# Patient Record
Sex: Female | Born: 1945 | Race: White | Hispanic: No | Marital: Married | Smoking: Never smoker
Health system: Southern US, Community
[De-identification: ages and names within clinical notes are randomized; demographics above are authoritative.]

## PROBLEM LIST (undated history)

## (undated) DIAGNOSIS — K219 Gastro-esophageal reflux disease without esophagitis: Secondary | ICD-10-CM

## (undated) DIAGNOSIS — T8859XA Other complications of anesthesia, initial encounter: Secondary | ICD-10-CM

## (undated) DIAGNOSIS — M5136 Other intervertebral disc degeneration, lumbar region: Secondary | ICD-10-CM

## (undated) DIAGNOSIS — I10 Essential (primary) hypertension: Secondary | ICD-10-CM

## (undated) DIAGNOSIS — F329 Major depressive disorder, single episode, unspecified: Secondary | ICD-10-CM

## (undated) DIAGNOSIS — H8109 Meniere's disease, unspecified ear: Secondary | ICD-10-CM

## (undated) DIAGNOSIS — M353 Polymyalgia rheumatica: Secondary | ICD-10-CM

## (undated) DIAGNOSIS — F32A Depression, unspecified: Secondary | ICD-10-CM

## (undated) DIAGNOSIS — M51369 Other intervertebral disc degeneration, lumbar region without mention of lumbar back pain or lower extremity pain: Secondary | ICD-10-CM

## (undated) DIAGNOSIS — F39 Unspecified mood [affective] disorder: Secondary | ICD-10-CM

## (undated) DIAGNOSIS — T4145XA Adverse effect of unspecified anesthetic, initial encounter: Secondary | ICD-10-CM

## (undated) DIAGNOSIS — F5105 Insomnia due to other mental disorder: Secondary | ICD-10-CM

## (undated) DIAGNOSIS — C50919 Malignant neoplasm of unspecified site of unspecified female breast: Secondary | ICD-10-CM

## (undated) DIAGNOSIS — Z9889 Other specified postprocedural states: Secondary | ICD-10-CM

## (undated) DIAGNOSIS — R112 Nausea with vomiting, unspecified: Secondary | ICD-10-CM

## (undated) DIAGNOSIS — Z803 Family history of malignant neoplasm of breast: Secondary | ICD-10-CM

## (undated) DIAGNOSIS — Z853 Personal history of malignant neoplasm of breast: Secondary | ICD-10-CM

## (undated) DIAGNOSIS — R5382 Chronic fatigue, unspecified: Secondary | ICD-10-CM

## (undated) DIAGNOSIS — J189 Pneumonia, unspecified organism: Secondary | ICD-10-CM

## (undated) DIAGNOSIS — Z923 Personal history of irradiation: Secondary | ICD-10-CM

## (undated) DIAGNOSIS — F419 Anxiety disorder, unspecified: Secondary | ICD-10-CM

## (undated) HISTORY — DX: Personal history of irradiation: Z92.3

## (undated) HISTORY — DX: Other intervertebral disc degeneration, lumbar region: M51.36

## (undated) HISTORY — DX: Family history of malignant neoplasm of breast: Z80.3

## (undated) HISTORY — DX: Malignant neoplasm of unspecified site of unspecified female breast: C50.919

## (undated) HISTORY — PX: CATARACT EXTRACTION W/ INTRAOCULAR LENS IMPLANT: SHX1309

## (undated) HISTORY — DX: Personal history of malignant neoplasm of breast: Z85.3

## (undated) HISTORY — DX: Other intervertebral disc degeneration, lumbar region without mention of lumbar back pain or lower extremity pain: M51.369

## (undated) HISTORY — DX: Insomnia due to other mental disorder: F51.05

## (undated) HISTORY — DX: Polymyalgia rheumatica: M35.3

## (undated) HISTORY — DX: Unspecified mood (affective) disorder: F39

## (undated) HISTORY — DX: Meniere's disease, unspecified ear: H81.09

## (undated) HISTORY — DX: Anxiety disorder, unspecified: F41.9

## (undated) HISTORY — PX: ABDOMINAL HYSTERECTOMY: SHX81

## (undated) HISTORY — PX: APPENDECTOMY: SHX54

## (undated) HISTORY — PX: CHOLECYSTECTOMY: SHX55

## (undated) HISTORY — DX: Chronic fatigue, unspecified: R53.82

## (undated) HISTORY — DX: Gastro-esophageal reflux disease without esophagitis: K21.9

## (undated) HISTORY — DX: Essential (primary) hypertension: I10

---

## 2003-04-28 HISTORY — PX: COLONOSCOPY: SHX174

## 2003-04-28 HISTORY — PX: ESOPHAGOGASTRODUODENOSCOPY: SHX1529

## 2003-07-30 ENCOUNTER — Other Ambulatory Visit: Admission: RE | Admit: 2003-07-30 | Discharge: 2003-07-30 | Payer: Self-pay | Admitting: Dermatology

## 2003-11-20 ENCOUNTER — Ambulatory Visit (HOSPITAL_COMMUNITY): Admission: RE | Admit: 2003-11-20 | Discharge: 2003-11-20 | Payer: Self-pay | Admitting: Internal Medicine

## 2005-04-28 ENCOUNTER — Ambulatory Visit: Payer: Self-pay | Admitting: Internal Medicine

## 2006-05-19 ENCOUNTER — Ambulatory Visit: Payer: Self-pay | Admitting: Internal Medicine

## 2007-05-05 ENCOUNTER — Ambulatory Visit: Payer: Self-pay | Admitting: Internal Medicine

## 2008-11-06 ENCOUNTER — Encounter: Payer: Self-pay | Admitting: Gastroenterology

## 2009-07-26 HISTORY — PX: MASTECTOMY: SHX3

## 2009-08-26 ENCOUNTER — Observation Stay (HOSPITAL_COMMUNITY)
Admission: RE | Admit: 2009-08-26 | Discharge: 2009-08-27 | Payer: Self-pay | Source: Home / Self Care | Admitting: General Surgery

## 2009-08-26 ENCOUNTER — Encounter (INDEPENDENT_AMBULATORY_CARE_PROVIDER_SITE_OTHER): Payer: Self-pay | Admitting: General Surgery

## 2009-09-27 ENCOUNTER — Ambulatory Visit: Admission: RE | Admit: 2009-09-27 | Discharge: 2009-12-26 | Payer: Self-pay | Admitting: Radiation Oncology

## 2010-07-15 LAB — CBC
Hemoglobin: 12.8 g/dL (ref 12.0–15.0)
MCV: 86.3 fL (ref 78.0–100.0)
RBC: 4.17 MIL/uL (ref 3.87–5.11)
WBC: 4.9 10*3/uL (ref 4.0–10.5)

## 2010-07-15 LAB — BASIC METABOLIC PANEL
Chloride: 101 mEq/L (ref 96–112)
Creatinine, Ser: 0.83 mg/dL (ref 0.4–1.2)
GFR calc Af Amer: >60 mL/min (ref >60)
Potassium: 3.4 mEq/L — ABNORMAL LOW (ref 3.5–5.1)
Sodium: 137 mEq/L (ref 135–145)

## 2010-07-15 LAB — TYPE AND SCREEN: ABO/RH(D): O POS

## 2010-07-15 LAB — MRSA PCR SCREENING

## 2010-09-09 NOTE — Assessment & Plan Note (Signed)
NAME:  Alexandra Haynes, Alexandra Haynes               CHART#:  [id number]   DATE:  05/05/2007                       DOB:  [birth year]   CHIEF COMPLAINT:  Followup GERD and diarrhea-predominant IBS.   SUBJECTIVE:  Alexandra Haynes is a 65 year old Caucasian female who has  history of GERD which had previously had been responsive to PPI.  She  developed hot flashes and sweating with Aciphex, therefore she has  stopped it on her own.  She tells me she rarely has problems with acid  reflux, maybe once a month she will notice indigestion and  regurgitation, but only with certain foods.  She feels much better and  does not feel the need for daily PPI at this time.  She continues to  have intermittent diarrhea.  She tells me her IBS symptoms have been  much improved.  She takes an occasional hyoscyamine up to twice a day  but only when she is symptomatic which has been rare recently.  She is  using flaxseed daily.  Overall she feels very well.  She has lost 5  pounds and this has been intentional.   CURRENT MEDICATIONS:  See the list from 05/05/2007.   ALLERGIES:  Sulfa.   OBJECTIVE:  VITAL SIGNS:  Weight 168 pounds, height 65 inches, temp 98  degrees, blood pressure 120/88, pulse 72.  GENERAL:  Alexandra Haynes is well-developed, well-nourished Caucasian female  in no acute distress.  HEENT:  Sclerae clear, nonicteric.  Conjunctivae pink.  Oral pharynx  pink and moist.  CHEST:  Heart regular rate and rhythm.  Normal S1, S2.  ABDOMEN:  Positive bowel sounds times 4, no bruits auscultated; soft,  nontender, nondistended, no palpable masses, hepatosplenomegaly, rebound  tenderness or guarding.  EXTREMITIES:  Without clubbing or  edema bilaterally.   ASSESSMENT:  Alexandra Haynes is a 65 year old female with diarrhea,  predominant IBS well controlled with p.r.n. hyoscyamine.   GERD with rare symptoms.   PLAN:  1. She can use over-the-counter Pepcid AC or Zantac for rare GERD      symptoms.  2. GERD and IBS literature  given for her review.  3. Hyoscyamine 0.125 mg a.c. and h.s., #60 with 2 refills.  4. Colonoscopy 2015 or sooner if she has any problems.       Lorenza Burton, N.P.  Electronically Signed     R. Roetta Sessions, M.D.  Electronically Signed    KJ/MEDQ  D:  05/06/2007  T:  05/06/2007  Job:  [id number]   cc:   Wyvonnia Lora

## 2010-09-12 NOTE — Op Note (Signed)
NAME:  Alexandra Haynes, Alexandra Haynes                        ACCOUNT NO.:  [id number]   MEDICAL RECORD NO.:  [id number]                   PATIENT TYPE:  AMB   LOCATION:  DAY                                  FACILITY:  [facility]   PHYSICIAN:  R. Roetta Sessions, M.D.              DATE OF BIRTH:  01/01/1946   DATE OF PROCEDURE:  11/20/2003  DATE OF DISCHARGE:                                 OPERATIVE REPORT   PROCEDURES:  1. Esophagogastroduodenoscopy with dilation.  2. Colonoscopy screening.   ENDOSCOPIST:  Gerrit Friends. Rourk, M.D.   INDICATIONS FOR PROCEDURE:  The patient is a 65 year old lady with  esophageal dysphagia for both solids and liquids, postprandial abdominal  cramps, alternating constipation and diarrhea.  She is here for EGD to  evaluate her dysphagia.  She has never had a colonoscopy.  She is here for  screening colonoscopy as well.  This approach has been discussed with the  patient previously and again the potential risks, benefits and alternatives  have been reviewed and questions answered.  She is agreeable.  Please see  documentation of medical records.   DESCRIPTION OF PROCEDURE:  1. Oxygen saturation, blood pressure, pulse and respirations were monitored     throughout the entire procedure.  Conscious sedation with Versed 4 mg IV     and Demerol 75 mg IV in divided doses.  The instrument was the Olympus     video chip system.   Findings:  Examination of the tubular esophagus revealed a Schatzki's ring.  The remainder of the esophageal mucosa appeared normal.  The EG junction was  easily traversed.   Stomach:  The gastric cavity was entered, insufflated well with air.  Thorough examination of the gastric mucosa including a retroflexed view of  the proximal stomach, esophagus, and gastric junction demonstrated a small  hiatal hernia.  The pylorus was patent and easily traversed.  Examination of  the bulb and second portion revealed no abnormalities.   Therapy/diagnostic  maneuvers performed:  A 56 French Maloney dilator was  passed to full insertion.  Loop back revealed no apparent complications  related to passage of the dilator.   The patient tolerated the procedure well and was prepared for colonoscopy.   1. Digital rectal examination revealed no abnormalities.   Endoscopic findings.  Prep was good.  Rectum:  Examination of the rectal  mucosa including retroflexed view of the anal verge revealed no  abnormalities.   Colonic mucosa was surveyed from the rectosigmoid junction through a left  transverse right colon to the appendiceal orifice and ileocecal valve and  cecum.  These structures were well seen and photographed from the rectum.  The scope was slowly withdrawn.  All previous mucosa surfaces were again  seen.  The colonic mucosa appeared normal.  The patient tolerated the above  procedures well and was reactive in endoscopy.   IMPRESSION:  1. Esophagogastroduodenoscopy     a.  Noncritical Schatzki's ring.  Otherwise normal esophagus status post        dilation as described above.     b. Small hiatal hernia, otherwise normal stomach.  Normal D1-D2.  2. Colonoscopy findings:     a. Normal rectum.     b. Normal colon.   RECOMMENDATIONS:  1. Stop Nexium as this may be contributing to some of her abdominal     symptoms.  2. Begin Aciphex 20 mg daily one hour before breakfast.  3. Trial of NuLev one tablet under the tongue a.c. and h.s. p.r.n. abdominal     cramps and diarrhea.  4. Follow up appointment with me in six weeks to assess her progress.      ___________________________________________                                            Jonathon Bellows, M.D.   RMR/MEDQ  D:  11/20/2003  T:  11/20/2003  Job:  [id number]   cc:   Wyvonnia Lora  [address]  [place]  [place] [address]  Fax: [phone]

## 2010-09-12 NOTE — Consult Note (Signed)
Alexandra Haynes, Alexandra Haynes                          ACCOUNT NO.:  [id number]   MEDICAL RECORD NO.:  [id number]                  PATIENT TYPE:   LOCATION:                                       FACILITY:   PHYSICIAN:  R. Roetta Sessions, M.D.              DATE OF BIRTH:  [birth year]   DATE OF CONSULTATION:  10/25/2003  DATE OF DISCHARGE:                                   CONSULTATION   CHIEF COMPLAINT:  Abdominal pain.   The patient is a 65 year old Caucasian female who had previously been seen  in this clinic on greater than 10 years ago.  The patient has been referred  by Dr. Margo Common for her abdominal pain, cramps and gas.  The patient is status  post laparoscopic cholecystectomy for cholelithiasis in January of 2002.  The patient states that she has GERD, not responding to Nexium 40 mg q.d.  She says in the past several weeks she has noticed an increase in her  symptoms.  She denies hematemesis and does state that she has swallowing  difficulties with food and pills seeming to stick in the midsternal area.  The patient also reports dysphagia to liquids, stating that she cannot drink  a large bolus of liquid at one time and has to take small sips.  Regarding  her bowel movements, the patient states that she has alternating  constipation and diarrhea.  She denies hematochezia or melena.  The patient  states that she has had no loss of appetite or loss of weight.  She does  report some nausea, but suffers from Meniere's disease and attributes this  to vertigo.  The patient had an EGD many years ago and states that she has  never had a colonoscopy at the age of 80.   CURRENT MEDICATIONS:  1. Nexium 40 mg one q.d.  2. Potassium chloride two q.d.  3. Triam/HCTZ 37.5/12.5 one q.d.  4. Prozac 20 mg one q.d.  5. Multiple vitamin one q.d.  6. __________, which is an herbal supplement, one q.d.   PAST MEDICAL HISTORY:  1. Meniere's disease.  2. Chronic fatigue and malaise.   PAST SURGICAL  HISTORY:  1. Cholecystectomy.  2. Appendectomy.  3. Exploratory laparoscopy.   ALLERGIES:  SULFA.   FAMILY HISTORY:  The patient's mother is dead and had Alzheimer's disease.  Her father is deceased from complications of pneumonia and heart disease.  She has three sisters; one with irritable bowel syndrome.  She has two  brothers; one who died of cancer and one who is living.  The patient is  married.  She has two children.  Her daughter has irritable bowel syndrome.  She is self-employed in [employer], [employer].  She denies the use of alcohol,  tobacco or recreational drugs.   PHYSICAL EXAMINATION:  VITAL SIGNS:  Weight 159.5; height 5 feet 5 inches;  blood pressure 100/70; pulse is 82.  CARDIOVASCULAR:  Regular  rate and rhythm without murmurs, rubs or gallops.  RESPIRATIONS:  Clear to auscultation bilaterally.  EXTREMITIES:  No edema.  No clubbing or cyanosis.  RECTAL:  Hemoccult negative.  ABDOMEN:  Soft, nontender without hepatosplenomegaly or masses.  Bowel  sounds are present in all four quadrants.   IMPRESSION:  Worsening symptoms of gastroesophageal reflux disease, not  responding to q.d. Nexium 40 mg.  The patient has not had an  esophagogastroduodenoscopy in many years and, considering the worsening of  her symptoms, would benefit from having an esophagogastroduodenoscopy.  The  patient has never had a colonoscopy at the age of 66 and in view of her  abdominal pain, would benefit from this procedure also.  The patient's  symptoms of alternating constipation and diarrhea as well as gas, bloating  and pain would be in keeping with the diagnosis of irritable bowel syndrome.  If colonoscopy findings are negative, the patient could be started on an  antispasmodic.  In the meantime, the patient would benefit from the addition  of fiber to her diet and samples will be given.   RECOMMENDATIONS:  1. An EGD and TCS scheduled were Dr. Jena Gauss.  2. Increase Nexium to b.i.d.  3. Add  fiber supplement q.d.  The patient was given samples of various     products to choose the one that best suits her lifestyle.  4. Consider addition of NuLev after results are available from colonoscopy.  5. More recommendations to follow after the above procedures.   Thank you for allowing [place] to participate in this patient's care.     ________________________________________  ___________________________________________  Ashok Pall, PA                           Jonathon Bellows, M.D.   GC/MEDQ  D:  10/25/2003  T:  10/25/2003  Job:  [id number]   cc:   Wyvonnia Lora  [address]  [place]  [place] [address]  Fax: [phone]

## 2012-03-01 DIAGNOSIS — K589 Irritable bowel syndrome without diarrhea: Secondary | ICD-10-CM

## 2012-03-01 DIAGNOSIS — C50919 Malignant neoplasm of unspecified site of unspecified female breast: Secondary | ICD-10-CM

## 2012-03-01 DIAGNOSIS — H8109 Meniere's disease, unspecified ear: Secondary | ICD-10-CM

## 2012-08-30 DIAGNOSIS — E289 Ovarian dysfunction, unspecified: Secondary | ICD-10-CM

## 2012-08-30 DIAGNOSIS — C50919 Malignant neoplasm of unspecified site of unspecified female breast: Secondary | ICD-10-CM

## 2013-03-20 DIAGNOSIS — M899 Disorder of bone, unspecified: Secondary | ICD-10-CM

## 2013-03-20 DIAGNOSIS — C50919 Malignant neoplasm of unspecified site of unspecified female breast: Secondary | ICD-10-CM

## 2013-03-20 DIAGNOSIS — M949 Disorder of cartilage, unspecified: Secondary | ICD-10-CM

## 2013-06-26 ENCOUNTER — Ambulatory Visit
Admission: RE | Admit: 2013-06-26 | Discharge: 2013-06-26 | Disposition: A | Discharge status: Home / Self Care | Payer: Medicare Other | Source: Ambulatory Visit | Attending: Rheumatology | Admitting: Rheumatology

## 2013-06-26 ENCOUNTER — Other Ambulatory Visit: Payer: Self-pay | Admitting: Rheumatology

## 2013-06-26 DIAGNOSIS — M545 Low back pain, unspecified: Secondary | ICD-10-CM

## 2013-06-26 DIAGNOSIS — M542 Cervicalgia: Secondary | ICD-10-CM

## 2013-11-21 ENCOUNTER — Other Ambulatory Visit (HOSPITAL_COMMUNITY): Payer: Self-pay | Admitting: Rheumatology

## 2013-11-21 DIAGNOSIS — M5442 Lumbago with sciatica, left side: Principal | ICD-10-CM

## 2013-11-21 DIAGNOSIS — M5441 Lumbago with sciatica, right side: Secondary | ICD-10-CM

## 2013-11-28 ENCOUNTER — Ambulatory Visit (HOSPITAL_COMMUNITY): Payer: Medicare Other

## 2013-12-12 ENCOUNTER — Ambulatory Visit (HOSPITAL_COMMUNITY)
Admission: RE | Admit: 2013-12-12 | Discharge: 2013-12-12 | Disposition: A | Discharge status: Home / Self Care | Payer: Medicare Other | Source: Ambulatory Visit | Attending: Rheumatology | Admitting: Rheumatology

## 2013-12-12 ENCOUNTER — Encounter (HOSPITAL_COMMUNITY): Payer: Self-pay

## 2013-12-12 DIAGNOSIS — M545 Low back pain, unspecified: Secondary | ICD-10-CM | POA: Diagnosis present

## 2013-12-12 DIAGNOSIS — M51379 Other intervertebral disc degeneration, lumbosacral region without mention of lumbar back pain or lower extremity pain: Secondary | ICD-10-CM | POA: Insufficient documentation

## 2013-12-12 DIAGNOSIS — M5442 Lumbago with sciatica, left side: Secondary | ICD-10-CM

## 2013-12-12 DIAGNOSIS — M5137 Other intervertebral disc degeneration, lumbosacral region: Secondary | ICD-10-CM | POA: Diagnosis not present

## 2013-12-12 DIAGNOSIS — M47817 Spondylosis without myelopathy or radiculopathy, lumbosacral region: Secondary | ICD-10-CM | POA: Insufficient documentation

## 2013-12-12 DIAGNOSIS — M5441 Lumbago with sciatica, right side: Secondary | ICD-10-CM

## 2013-12-22 ENCOUNTER — Other Ambulatory Visit: Payer: Self-pay | Admitting: Rheumatology

## 2013-12-22 DIAGNOSIS — M549 Dorsalgia, unspecified: Secondary | ICD-10-CM

## 2013-12-22 DIAGNOSIS — M5126 Other intervertebral disc displacement, lumbar region: Secondary | ICD-10-CM

## 2013-12-26 ENCOUNTER — Ambulatory Visit
Admission: RE | Admit: 2013-12-26 | Discharge: 2013-12-26 | Disposition: A | Discharge status: Home / Self Care | Payer: Medicare Other | Source: Ambulatory Visit | Attending: Rheumatology | Admitting: Rheumatology

## 2013-12-26 DIAGNOSIS — M549 Dorsalgia, unspecified: Secondary | ICD-10-CM

## 2013-12-26 DIAGNOSIS — M5126 Other intervertebral disc displacement, lumbar region: Secondary | ICD-10-CM

## 2013-12-26 MED ORDER — IOHEXOL 180 MG/ML  SOLN
1.0000 mL | Freq: Once | INTRAMUSCULAR | Status: AC | PRN
  Administered 2013-12-26: 1 mL via EPIDURAL

## 2013-12-26 MED ORDER — METHYLPREDNISOLONE ACETATE 40 MG/ML INJ SUSP (RADIOLOG
120.0000 mg | Freq: Once | INTRAMUSCULAR | Status: AC
  Administered 2013-12-26: 120 mg via EPIDURAL

## 2013-12-26 NOTE — Discharge Instructions (Signed)

## 2014-01-31 ENCOUNTER — Other Ambulatory Visit: Payer: Self-pay | Admitting: Rheumatology

## 2014-01-31 DIAGNOSIS — M5126 Other intervertebral disc displacement, lumbar region: Secondary | ICD-10-CM

## 2014-02-13 ENCOUNTER — Ambulatory Visit
Admission: RE | Admit: 2014-02-13 | Discharge: 2014-02-13 | Disposition: A | Discharge status: Home / Self Care | Payer: Medicare Other | Source: Ambulatory Visit | Attending: Rheumatology | Admitting: Rheumatology

## 2014-02-13 DIAGNOSIS — M5126 Other intervertebral disc displacement, lumbar region: Secondary | ICD-10-CM

## 2014-02-13 MED ORDER — METHYLPREDNISOLONE ACETATE 40 MG/ML INJ SUSP (RADIOLOG
120.0000 mg | Freq: Once | INTRAMUSCULAR | Status: AC
  Administered 2014-02-13: 120 mg via EPIDURAL

## 2014-02-13 MED ORDER — IOHEXOL 180 MG/ML  SOLN
1.0000 mL | Freq: Once | INTRAMUSCULAR | Status: AC | PRN
  Administered 2014-02-13: 1 mL via EPIDURAL

## 2014-02-13 NOTE — Discharge Instructions (Signed)

## 2015-11-21 ENCOUNTER — Other Ambulatory Visit (HOSPITAL_COMMUNITY): Payer: Self-pay | Admitting: General Surgery

## 2015-11-21 DIAGNOSIS — N649 Disorder of breast, unspecified: Secondary | ICD-10-CM

## 2015-11-21 DIAGNOSIS — Z9012 Acquired absence of left breast and nipple: Secondary | ICD-10-CM

## 2015-11-26 ENCOUNTER — Ambulatory Visit (HOSPITAL_COMMUNITY)
Admission: RE | Admit: 2015-11-26 | Discharge: 2015-11-26 | Disposition: A | Discharge status: Home / Self Care | Payer: Medicare Other | Source: Ambulatory Visit | Attending: General Surgery | Admitting: General Surgery

## 2015-11-26 DIAGNOSIS — N643 Galactorrhea not associated with childbirth: Secondary | ICD-10-CM | POA: Insufficient documentation

## 2015-11-26 DIAGNOSIS — Z9012 Acquired absence of left breast and nipple: Secondary | ICD-10-CM

## 2015-11-26 DIAGNOSIS — N649 Disorder of breast, unspecified: Secondary | ICD-10-CM

## 2017-06-01 ENCOUNTER — Other Ambulatory Visit: Payer: Self-pay

## 2017-06-01 ENCOUNTER — Encounter (HOSPITAL_COMMUNITY): Payer: Self-pay | Admitting: Emergency Medicine

## 2017-06-01 DIAGNOSIS — R0600 Dyspnea, unspecified: Principal | ICD-10-CM | POA: Insufficient documentation

## 2017-06-01 DIAGNOSIS — I10 Essential (primary) hypertension: Secondary | ICD-10-CM | POA: Insufficient documentation

## 2017-06-01 DIAGNOSIS — R079 Chest pain, unspecified: Secondary | ICD-10-CM | POA: Diagnosis present

## 2017-06-01 DIAGNOSIS — R42 Dizziness and giddiness: Secondary | ICD-10-CM | POA: Diagnosis not present

## 2017-06-01 DIAGNOSIS — Z79899 Other long term (current) drug therapy: Secondary | ICD-10-CM | POA: Diagnosis not present

## 2017-06-01 DIAGNOSIS — R55 Syncope and collapse: Secondary | ICD-10-CM | POA: Insufficient documentation

## 2017-06-01 NOTE — ED Triage Notes (Signed)
Pt c/o her blood pressure has been high at home since 1700. Pt states she has felt weak last couple of days.

## 2017-06-02 ENCOUNTER — Other Ambulatory Visit: Payer: Self-pay

## 2017-06-02 ENCOUNTER — Emergency Department (HOSPITAL_COMMUNITY): Payer: Medicare Other

## 2017-06-02 ENCOUNTER — Encounter (HOSPITAL_COMMUNITY): Payer: Self-pay

## 2017-06-02 ENCOUNTER — Observation Stay (HOSPITAL_COMMUNITY): Payer: Medicare Other

## 2017-06-02 ENCOUNTER — Observation Stay (HOSPITAL_COMMUNITY)
Admission: EM | Admit: 2017-06-02 | Discharge: 2017-06-03 | Disposition: A | Discharge status: Home / Self Care | Payer: Medicare Other | Attending: Family Medicine | Admitting: Family Medicine

## 2017-06-02 ENCOUNTER — Observation Stay (HOSPITAL_BASED_OUTPATIENT_CLINIC_OR_DEPARTMENT_OTHER): Payer: Medicare Other

## 2017-06-02 DIAGNOSIS — R0600 Dyspnea, unspecified: Secondary | ICD-10-CM

## 2017-06-02 DIAGNOSIS — R42 Dizziness and giddiness: Secondary | ICD-10-CM

## 2017-06-02 DIAGNOSIS — R0609 Other forms of dyspnea: Secondary | ICD-10-CM

## 2017-06-02 DIAGNOSIS — R079 Chest pain, unspecified: Secondary | ICD-10-CM

## 2017-06-02 DIAGNOSIS — R55 Syncope and collapse: Secondary | ICD-10-CM

## 2017-06-02 DIAGNOSIS — I10 Essential (primary) hypertension: Secondary | ICD-10-CM

## 2017-06-02 LAB — BRAIN NATRIURETIC PEPTIDE: B Natriuretic Peptide: 23 pg/mL (ref 0.0–100.0)

## 2017-06-02 LAB — CBC WITH DIFFERENTIAL/PLATELET
Basophils Absolute: 0 10*3/uL (ref 0.0–0.1)
Basophils Relative: 0 %
EOS ABS: 0 10*3/uL (ref 0.0–0.7)
Eosinophils Relative: 1 %
HCT: 37 % (ref 36.0–46.0)
Hemoglobin: 12.1 g/dL (ref 12.0–15.0)
LYMPHS ABS: 1.5 10*3/uL (ref 0.7–4.0)
LYMPHS PCT: 23 %
MCH: 27.5 pg (ref 26.0–34.0)
MCHC: 32.7 g/dL (ref 30.0–36.0)
MCV: 84.1 fL (ref 78.0–100.0)
Monocytes Absolute: 0.7 10*3/uL (ref 0.1–1.0)
Monocytes Relative: 10 %
NEUTROS PCT: 66 %
Neutro Abs: 4.4 10*3/uL (ref 1.7–7.7)
Platelets: 252 10*3/uL (ref 150–400)
RBC: 4.4 MIL/uL (ref 3.87–5.11)
RDW: 13.2 % (ref 11.5–15.5)
WBC: 6.6 10*3/uL (ref 4.0–10.5)

## 2017-06-02 LAB — ECHOCARDIOGRAM COMPLETE
E decel time: 239 msec
E/e' ratio: 10.82
FS: 36 % (ref 28–44)
IV/PV OW: 0.99
LA vol A4C: 33.6 ml
LA vol index: 18.5 mL/m2
LA vol: 35.8 mL
LADIAMINDEX: 1.6 cm/m2
LASIZE: 31 mm
LDCA: 2.54 cm2
LEFT ATRIUM END SYS DIAM: 31 mm
LV E/e' medial: 10.82
LV E/e'average: 10.82
LV PW d: 10.5 mm — AB (ref 0.6–1.1)
LV e' LATERAL: 7.94 cm/s
LVOT VTI: 21.2 cm
LVOT diameter: 18 mm
LVOT peak grad rest: 4 mmHg
LVOT peak vel: 94.6 cm/s
LVOTSV: 54 mL
MV Dec: 239
MV Peak grad: 3 mmHg
MV pk E vel: 85.9 m/s
MVPKAVEL: 104 m/s
RV LATERAL S' VELOCITY: 15.3 cm/s
RV TAPSE: 25.2 mm
TDI e' lateral: 7.94
TDI e' medial: 4.79

## 2017-06-02 LAB — LIPID PANEL
CHOL/HDL RATIO: 2.2 ratio
Cholesterol: 173 mg/dL (ref 0–200)
HDL: 79 mg/dL (ref >40)
LDL CALC: 76 mg/dL (ref 0–99)
Triglycerides: 91 mg/dL (ref <150)
VLDL: 18 mg/dL (ref 0–40)

## 2017-06-02 LAB — COMPREHENSIVE METABOLIC PANEL
ALK PHOS: 75 U/L (ref 38–126)
ALT: 9 U/L — ABNORMAL LOW (ref 14–54)
AST: 21 U/L (ref 15–41)
Albumin: 4.2 g/dL (ref 3.5–5.0)
Anion gap: 13 (ref 5–15)
BUN: 15 mg/dL (ref 6–20)
CALCIUM: 9.6 mg/dL (ref 8.9–10.3)
CO2: 25 mmol/L (ref 22–32)
Chloride: 95 mmol/L — ABNORMAL LOW (ref 101–111)
Creatinine, Ser: 0.83 mg/dL (ref 0.44–1.00)
GFR calc non Af Amer: >60 mL/min (ref >60)
GLUCOSE: 108 mg/dL — AB (ref 65–99)
Potassium: 3.7 mmol/L (ref 3.5–5.1)
SODIUM: 133 mmol/L — AB (ref 135–145)
Total Bilirubin: 0.4 mg/dL (ref 0.3–1.2)
Total Protein: 8 g/dL (ref 6.5–8.1)

## 2017-06-02 LAB — I-STAT TROPONIN, ED
Troponin i, poc: 0 ng/mL (ref 0.00–0.08)
Troponin i, poc: 0.01 ng/mL (ref 0.00–0.08)

## 2017-06-02 LAB — MRSA PCR SCREENING: MRSA by PCR: NEGATIVE

## 2017-06-02 LAB — T4, FREE: Free T4: 0.86 ng/dL (ref 0.61–1.12)

## 2017-06-02 LAB — TSH: TSH: 8.145 u[IU]/mL — AB (ref 0.350–4.500)

## 2017-06-02 MED ORDER — LISINOPRIL 10 MG PO TABS: 10.0000 mg | ORAL_TABLET | Freq: Every day | ORAL | Status: DC

## 2017-06-02 MED ORDER — ENOXAPARIN SODIUM 40 MG/0.4ML ~~LOC~~ SOLN: 40.0000 mg | SUBCUTANEOUS | Status: DC

## 2017-06-02 MED ORDER — LISINOPRIL 10 MG PO TABS
20.0000 mg | ORAL_TABLET | Freq: Every day | ORAL | Status: DC
  Administered 2017-06-02 – 2017-06-03 (×2): 20 mg via ORAL
  Filled 2017-06-02 (×2): qty 2

## 2017-06-02 MED ORDER — ONDANSETRON HCL 4 MG/2ML IJ SOLN: 4.0000 mg | Freq: Four times a day (QID) | INTRAMUSCULAR | Status: DC | PRN

## 2017-06-02 MED ORDER — ASPIRIN 81 MG PO CHEW
324.0000 mg | CHEWABLE_TABLET | Freq: Once | ORAL | Status: AC
  Administered 2017-06-02: 324 mg via ORAL
  Filled 2017-06-02: qty 4

## 2017-06-02 MED ORDER — ACETAMINOPHEN 325 MG PO TABS: 650.0000 mg | ORAL_TABLET | ORAL | Status: DC | PRN

## 2017-06-02 NOTE — ED Provider Notes (Signed)
[facility] EMERGENCY DEPARTMENT Provider Note   CSN: [id number] Arrival date & time: 06/01/17  2311     History   Chief Complaint Chief Complaint  Patient presents with  . Hypertension    HPI Alexandra Haynes is a 73 y.o. female.  The history is provided by the patient. No language interpreter was used.  Shortness of Breath  This is a new problem. The average episode lasts 1 day. The problem occurs intermittently.The current episode started 12 to 24 hours ago. The problem has been gradually improving. Pertinent negatives include no fever, no headaches, no cough and no chest pain. She has tried nothing for the symptoms. The treatment provided no relief. Associated medical issues do not include asthma or CAD.  Pt reports she was walking with husband today and became very short of breath.  Pt reports she had walked about 1/2  of a block.  Pt reports she did not think she was going to get home.  Pt reports she felt weak.  Pt reports she has been having increased weakness recently.  Pt reports her blood pressure has been running a little high.  Pt husband has been checking her blood pressure at home.  History reviewed. No pertinent past medical history.  There are no active problems to display for this patient.   History reviewed. No pertinent surgical history.  OB History    No data available       Home Medications    Prior to Admission medications   Medication Sig Start Date End Date Taking? Authorizing Provider  AZO-CRANBERRY PO Take by mouth.   Yes [provider]  DULoxetine (CYMBALTA) 60 MG capsule Take 60 mg by mouth.   Yes [provider]  Multiple Vitamins-Minerals (THERA-M) TABS Take 1 tablet by mouth.   Yes [provider]  potassium chloride (K-DUR) 10 MEQ tablet Take 20 mEq by mouth. 09/11/13  Yes [provider]  triamterene-hydrochlorothiazide (MAXZIDE-25) 37.5-25 MG per tablet Take 1 tablet by mouth.   Yes [provider]    Family History No family history on file.  Social History Social History   Tobacco Use  . Smoking status: Never Smoker  . Smokeless tobacco: Never Used  Substance Use Topics  . Alcohol use: Not on file  . Drug use: Not on file     Allergies   Latex and Sulfa antibiotics   Review of Systems Review of Systems  Constitutional: Negative for fever.  Respiratory: Positive for shortness of breath. Negative for cough.   Cardiovascular: Negative for chest pain.  Neurological: Negative for headaches.  All other systems reviewed and are negative.    Physical Exam Updated Vital Signs BP (!) 163/79   Pulse 84   Temp 98.2 F (36.8 C)   Resp 18   Ht 5\' 5"  (1.651 m)   Wt 79.8 kg (176 lb)   SpO2 98%   BMI 29.29 kg/m   Physical Exam  Constitutional: She appears well-developed and well-nourished. No distress.  HENT:  Head: Normocephalic and atraumatic.  Right Ear: External ear normal.  Left Ear: External ear normal.  Nose: Nose normal.  Mouth/Throat: Oropharynx is clear and moist.  Eyes: Conjunctivae are normal.  Neck: Neck supple.  Cardiovascular: Normal rate and regular rhythm.  No murmur heard. Pulmonary/Chest: Effort normal and breath sounds normal. No respiratory distress.  Abdominal: Soft. There is no tenderness.  Musculoskeletal: She exhibits no edema.  Neurological: She is alert.  Skin: Skin is warm and  dry.  Psychiatric: She has a normal mood and affect.  Nursing note and vitals reviewed.    ED Treatments / Results  Labs (all labs ordered are listed, but only abnormal results are displayed) Labs Reviewed  CBC WITH DIFFERENTIAL/PLATELET  COMPREHENSIVE METABOLIC PANEL  I-STAT TROPONIN, ED    EKG  EKG Interpretation None       Radiology No results found.  Procedures Procedures (including critical care time)  Medications Ordered in ED Medications  aspirin chewable tablet 324 mg (not administered)     Initial Impression  / Assessment and Plan / ED Course  I have reviewed the triage vital signs and the nursing notes.  Pertinent labs & imaging results that were available during my care of the patient were reviewed by me and considered in my medical decision making (see chart for details).       Final Clinical Impressions(s) / ED Diagnoses   Final diagnoses:  None    ED Discharge Orders    None     1:00am.  Pt's care turned over to Dr. Christy Gentles. Labs pending.    Fransico Meadow, PA-C 06/02/17 0100    Ripley Fraise, MD 06/02/17 [phone]

## 2017-06-02 NOTE — ED Notes (Signed)
Report given to LIsa,RN at this time.

## 2017-06-02 NOTE — H&P (Addendum)
History and Physical    TKAI LARGE WUJ:[id number] DOB: 06-16-1945 DOA: 06/02/2017  PCP: Deloria Lair., MD   Patient coming from: Home  Chief Complaint: Exertional dyspnea  HPI: Alexandra Haynes is a 72 y.o. female with medical history significant for recently diagnosed hypertension as well as recent cataract surgery who presented to the ED after experiencing some mild weakness over the last 2 days.  She apparently went outside for a walk and was struggling to climb a slight incline due to some dyspnea that she is began to experience.  She denied any chest discomfort or pain, but did have some mild palpitations.  This quickly resolved with rest and the entire episode lasted approximately 1 hour.  She was then working in her garden and began to feel somewhat flush and nauseous and had some diaphoresis.  She states that she almost felt as though she was going to pass out, but did not do so.  She then presented to the ED for further evaluation.   ED Course: Vital signs are stable with some hypertension noted.  EKG with no acute findings and troponin 0.  Chest x-ray with some cardiomegaly, but no other findings otherwise noted.  Patient is currently asymptomatic.  Laboratory data demonstrates a mild hyponatremia and hypochloremia.  She has thus far, been given a full dose aspirin.  Review of Systems: As per HPI otherwise 10 point review of systems negative.   History reviewed. No pertinent past medical history.  History reviewed. No pertinent surgical history.   reports that  has never smoked. she has never used smokeless tobacco. Her alcohol and drug histories are not on file.  Allergies  Allergen Reactions  . Latex Other (See Comments)    Causes blisters  . Sulfa Antibiotics Other (See Comments)    Made her pass out.    No family history on file.  Prior to Admission medications   Medication Sig Start Date End Date Taking? Authorizing Provider  AZO-CRANBERRY PO Take by mouth.    Yes [provider]  DULoxetine (CYMBALTA) 60 MG capsule Take 60 mg by mouth.   Yes [provider]  Multiple Vitamins-Minerals (THERA-M) TABS Take 1 tablet by mouth.   Yes [provider]  potassium chloride (K-DUR) 10 MEQ tablet Take 20 mEq by mouth. 09/11/13  Yes [provider]  triamterene-hydrochlorothiazide (MAXZIDE-25) 37.5-25 MG per tablet Take 1 tablet by mouth.   Yes [provider]    Physical Exam: Vitals:   06/01/17 2352 06/01/17 2353 06/02/17 0130  BP: (!) 163/79  (!) 146/66  Pulse: 84  73  Resp: 18  17  Temp: 98.2 F (36.8 C)    SpO2: 98%  95%  Weight:  79.8 kg (176 lb)   Height:  5\' 5"  (1.651 m)     Constitutional: NAD, calm, comfortable Vitals:   06/01/17 2352 06/01/17 2353 06/02/17 0130  BP: (!) 163/79  (!) 146/66  Pulse: 84  73  Resp: 18  17  Temp: 98.2 F (36.8 C)    SpO2: 98%  95%  Weight:  79.8 kg (176 lb)   Height:  5\' 5"  (1.651 m)    Eyes: lids and conjunctivae normal ENMT: Mucous membranes are moist.  Neck: normal, supple Respiratory: clear to auscultation bilaterally. Normal respiratory effort. No accessory muscle use.  Cardiovascular: Regular rate and rhythm, no murmurs. No extremity edema. Abdomen: no tenderness, no distention. Bowel sounds positive.  Musculoskeletal:  No joint deformity upper and lower extremities.  Skin: no rashes, lesions, ulcers.  Psychiatric: Normal judgment and insight. Alert and oriented x 3. Normal mood.   Labs on Admission: I have personally reviewed following labs and imaging studies  CBC: Recent Labs  Lab 06/02/17 0057  WBC 6.6  NEUTROABS 4.4  HGB 12.1  HCT 37.0  MCV 84.1  PLT 119   Basic Metabolic Panel: Recent Labs  Lab 06/02/17 0057  NA 133*  K 3.7  CL 95*  CO2 25  GLUCOSE 108*  BUN 15  CREATININE 0.83  CALCIUM 9.6   GFR: Estimated Creatinine Clearance: 63 mL/min (by C-G formula based on SCr of 0.83 mg/dL). Liver Function Tests: Recent Labs    Lab 06/02/17 0057  AST 21  ALT 9*  ALKPHOS 75  BILITOT 0.4  PROT 8.0  ALBUMIN 4.2   No results for input(s): LIPASE, AMYLASE in the last 168 hours. No results for input(s): AMMONIA in the last 168 hours. Coagulation Profile: No results for input(s): INR, PROTIME in the last 168 hours. Cardiac Enzymes: No results for input(s): CKTOTAL, CKMB, CKMBINDEX, TROPONINI in the last 168 hours. BNP (last 3 results) No results for input(s): PROBNP in the last 8760 hours. HbA1C: No results for input(s): HGBA1C in the last 72 hours. CBG: No results for input(s): GLUCAP in the last 168 hours. Lipid Profile: No results for input(s): CHOL, HDL, LDLCALC, TRIG, CHOLHDL, LDLDIRECT in the last 72 hours. Thyroid Function Tests: No results for input(s): TSH, T4TOTAL, FREET4, T3FREE, THYROIDAB in the last 72 hours. Anemia Panel: No results for input(s): VITAMINB12, FOLATE, FERRITIN, TIBC, IRON, RETICCTPCT in the last 72 hours. Urine analysis: No results found for: COLORURINE, APPEARANCEUR, LABSPEC, PHURINE, GLUCOSEU, HGBUR, BILIRUBINUR, KETONESUR, PROTEINUR, UROBILINOGEN, NITRITE, LEUKOCYTESUR  Radiological Exams on Admission: Dg Chest Port 1 View  Result Date: 06/02/2017 CLINICAL DATA:  Chest discomfort high blood pressure EXAM: PORTABLE CHEST 1 VIEW COMPARISON:  None. FINDINGS: No acute pulmonary infiltrate or effusion. Borderline to mild cardiomegaly. Mediastinal contour within normal limits. Aortic atherosclerosis. No pneumothorax. IMPRESSION: Borderline to mild cardiomegaly. Electronically Signed   By: Donavan Foil M.D.   On: 06/02/2017 01:38    EKG: Independently reviewed. NSR with HR 75.  Assessment/Plan Principal Problem:   Dyspnea Active Problems:   Essential hypertension   Postural dizziness with presyncope    1. Exertional dyspnea.  Obtain 2D echocardiogram for further evaluation with cardiomegaly on CXR.  Patient currently asymptomatic without chest pain.  This may very likely be  related to her elevated blood pressure, but may have some structural heart changes as she has only recently been diagnosed.  Continue to trend troponin and monitor on telemetry due to heart score 4.  Check lipid panel as well as TSH. 2. Presyncope.  Obtain 2D echocardiogram, carotid ultrasound, and orthostatic vitals. 3. Hypertension-poorly controlled.  Initiate lisinopril 10 mg daily for now and will require outpatient reassessment of blood pressure control.  She is actually currently on no medications for this and has been told to try dietary control with follow-up in 3 months.   DVT prophylaxis: Lovenox Code Status: Full Family Communication: Husband at bedside Disposition Plan:Home in am Consults called:None Admission status: Obs, tele   Pratik Darleen Crocker DO [facility] Pager [phone]  If 7PM-7AM, please contact night-coverage [email or web address] Password [email or web address]  06/02/2017, 2:58 AM

## 2017-06-02 NOTE — Progress Notes (Signed)
*  PRELIMINARY RESULTS* Echocardiogram 2D Echocardiogram has been performed.  Samuel Germany 06/02/2017, 1:30 PM

## 2017-06-02 NOTE — Progress Notes (Signed)
PROGRESS NOTE    Patient: Alexandra Haynes     PCP: Deloria Lair., MD                    DOB: 1945-10-08            DOA: 06/02/2017 [id number]             DOS: 06/02/2017, 12:45 PM   Date of Service: the patient was seen and examined on 06/02/2017 Subjective:   Patient was seen and examined this morning.  Stable, in no acute distress.  Denies any chest pain or shortness of breath or dizziness.  Husband present at bedside. Per husband her blood pressure runs high at home  ----------------------------------------------------------------------------------------------------------------------  Brief Narrative:   Alexandra Haynes is a 72 y.o. female with medical history significant for recently diagnosed hypertension as well as recent cataract surgery who presented to the ED after experiencing some mild weakness over the last 2 days.  She apparently went outside for a walk and was struggling to climb a slight incline due to some dyspnea that she is began to experience.  She denied any chest discomfort or pain, but did have some mild palpitations.  This quickly resolved with rest and the entire episode lasted approximately 1 hour.  She was then working in her garden and began to feel somewhat flush and nauseous and had some diaphoresis.  She states that she almost felt as though she was going to pass out, but did not do so.  Principal Problem:   Dyspnea Active Problems:   Essential hypertension   Postural dizziness with presyncope   Assessment & Plan:   Exertional dyspnea -stable this a.m., no acute distress, reporting of improved  Body enzymes negative, pending 2D echocardiogram, Blood pressure still remains elevated,  Labs within normal limits, with exception of TSH mildly elevated, following up with free T3-T4  Presyncope -vasovagal versus, oxygen and blood pressure, pending 2D echocardiogram, bilateral carotid studies Orthostatic vitals have been normal  Continue to trend  troponin and monitor on telemetry due to heart score 4.    Presyncope.  Obtain 2D echocardiogram, carotid ultrasound, and orthostatic vitals.  Hypertension -poorly controlled, patient lisinopril has been increased from 20-20 mg today, will continue to monitor, will titrate and add medication accordingly.     DVT prophylaxis: Lovenox Code Status: Full Family Communication: Husband at bedside Disposition Plan:Home in am Consults called:None Admission status: Obs, tele   Procedures:    Echo  Antimicrobials:  Anti-infectives (From admission, onward)   None        Objective: Vitals:   06/02/17 0600 06/02/17 0630 06/02/17 0900 06/02/17 0921  BP: (!) 187/86 (!) 185/90 (!) 157/72 (!) 157/72  Pulse: 74 71 74 76  Resp: 16 17 16 18   Temp:      SpO2: 97% 98% 97% 98%  Weight:      Height:       No intake or output data in the 24 hours ending 06/02/17 1245 Filed Weights   06/01/17 2353  Weight: 79.8 kg (176 lb)    Examination:  General exam: Appears calm and comfortable  Respiratory system: Clear to auscultation. Respiratory effort normal. Cardiovascular system: S1 & S2 heard, RRR. No JVD, murmurs, rubs, gallops or clicks. No pedal edema. Gastrointestinal system: Abdomen is nondistended, soft and nontender. No organomegaly or masses felt. Normal bowel sounds heard. Central nervous system: Alert and oriented. No focal neurological deficits. Extremities: Symmetric 5 x 5 power. Skin: No  rashes, lesions or ulcers Psychiatry: Judgement and insight appear normal. Mood & affect appropriate.     Data Reviewed: I have personally reviewed following labs and imaging studies  CBC: Recent Labs  Lab 06/02/17 0057  WBC 6.6  NEUTROABS 4.4  HGB 12.1  HCT 37.0  MCV 84.1  PLT 211   Basic Metabolic Panel: Recent Labs  Lab 06/02/17 0057  NA 133*  K 3.7  CL 95*  CO2 25  GLUCOSE 108*  BUN 15  CREATININE 0.83  CALCIUM 9.6   GFR: Estimated Creatinine Clearance: 63  mL/min (by C-G formula based on SCr of 0.83 mg/dL). Liver Function Tests: Recent Labs  Lab 06/02/17 0057  AST 21  ALT 9*  ALKPHOS 75  BILITOT 0.4  PROT 8.0  ALBUMIN 4.2   No results for input(s): LIPASE, AMYLASE in the last 168 hours. No results for input(s): AMMONIA in the last 168 hours. Coagulation Profile: No results for input(s): INR, PROTIME in the last 168 hours. Cardiac Enzymes: No results for input(s): CKTOTAL, CKMB, CKMBINDEX, TROPONINI in the last 168 hours. BNP (last 3 results) No results for input(s): PROBNP in the last 8760 hours. HbA1C: No results for input(s): HGBA1C in the last 72 hours. CBG: No results for input(s): GLUCAP in the last 168 hours. Lipid Profile: Recent Labs    06/02/17 0348  CHOL 173  HDL 79  LDLCALC 76  TRIG 91  CHOLHDL 2.2   Thyroid Function Tests: Recent Labs    06/02/17 0057  TSH 8.145*   Anemia Panel: No results for input(s): VITAMINB12, FOLATE, FERRITIN, TIBC, IRON, RETICCTPCT in the last 72 hours. Sepsis Labs: No results for input(s): PROCALCITON, LATICACIDVEN in the last 168 hours.  No results found for this or any previous visit (from the past 240 hour(s)).     Radiology Studies: Dg Chest Port 1 View  Result Date: 06/02/2017 CLINICAL DATA:  Chest discomfort high blood pressure EXAM: PORTABLE CHEST 1 VIEW COMPARISON:  None. FINDINGS: No acute pulmonary infiltrate or effusion. Borderline to mild cardiomegaly. Mediastinal contour within normal limits. Aortic atherosclerosis. No pneumothorax. IMPRESSION: Borderline to mild cardiomegaly. Electronically Signed   By: Donavan Foil M.D.   On: 06/02/2017 01:38    Scheduled Meds: . enoxaparin (LOVENOX) injection  40 mg Subcutaneous Q24H  . lisinopril  20 mg Oral Daily   Continuous Infusions:   LOS: 0 days    Time spent: >25 minutes   Deatra James, MD [facility] Pager [phone]  If 7PM-7AM, please contact night-coverage [email or web address] Password  [email or web address] 06/02/2017, 12:45 PM

## 2017-06-02 NOTE — ED Notes (Signed)
Patient transported to Ultrasound 

## 2017-06-02 NOTE — Care Management Obs Status (Signed)
[place] NOTIFICATION   Patient Details  Name: Alexandra Haynes MRN: [id number] Date of Birth: [birth year]   Medicare Observation Status Notification Given:  Yes    Sherald Barge, RN 06/02/2017, 3:12 PM

## 2017-06-03 DIAGNOSIS — R0609 Other forms of dyspnea: Secondary | ICD-10-CM | POA: Diagnosis not present

## 2017-06-03 DIAGNOSIS — R0600 Dyspnea, unspecified: Secondary | ICD-10-CM | POA: Diagnosis not present

## 2017-06-03 LAB — T3, FREE: T3 FREE: 2.8 pg/mL (ref 2.0–4.4)

## 2017-06-03 MED ORDER — LISINOPRIL 20 MG PO TABS: 20.0000 mg | ORAL_TABLET | Freq: Every day | ORAL | 0 refills | Status: DC

## 2017-06-03 MED ORDER — ASPIRIN 81 MG PO TBEC: 81.0000 mg | DELAYED_RELEASE_TABLET | Freq: Every day | ORAL | 0 refills | Status: DC

## 2017-06-03 MED ORDER — ASPIRIN EC 81 MG PO TBEC
81.0000 mg | DELAYED_RELEASE_TABLET | Freq: Every day | ORAL | Status: DC
  Administered 2017-06-03: 81 mg via ORAL
  Filled 2017-06-03: qty 1

## 2017-06-03 NOTE — Progress Notes (Signed)
EKG completed and placed on chart 

## 2017-06-03 NOTE — Progress Notes (Signed)
PT Cancellation Note  Patient Details Name: Alexandra Haynes MRN: [id number] DOB: 1945/08/15   Cancelled Treatment:    Reason Eval/Treat Not Completed: Other (comment)Pt states she is getting around with independence and is imminently going home.  Declined visit.   Ramond Dial 06/03/2017, 10:38 AM   10:39 AM, 06/03/17 Mee Hives, PT, MS Physical Therapist - [place] [phone] (Office)

## 2017-06-03 NOTE — Discharge Summary (Signed)
Physician Discharge Summary      Patient: Alexandra Haynes                   Admit date: 06/02/2017   DOB: 1945/09/10             Discharge date:06/03/2017/9:14 AM [id number]                           PCP: Deloria Lair., MD Recommendations for Outpatient Follow-up:    Please follow-up with your primary care physician within 1-2 weeks.   Discharge Condition: Stable  CODE STATUS:  Full code  Diet recommendation:  Cardiac diet   ----------------------------------------------------------------------------------------------------------------------  Discharge Diagnoses:   Principal Problem:   Dyspnea Active Problems:   Essential hypertension   Postural dizziness with presyncope   History of present illness : Alexandra Haynes is a 72 y.o. female with medical history significant for recently diagnosed hypertension as well as recent cataract surgery who presented to the ED after experiencing some mild weakness over the last 2 days.  She apparently went outside for a walk and was struggling to climb a slight incline due to some dyspnea that she is began to experience.  She denied any chest discomfort or pain, but did have some mild palpitations.  This quickly resolved with rest and the entire episode lasted approximately 1 hour.  She was then working in her garden and began to feel somewhat flush and nauseous and had some diaphoresis.  She states that she almost felt as though she was going to pass out, but did not do so.   ED Course: Vital signs are stable with some hypertension noted.  EKG with no acute findings and troponin 0.  Chest x-ray with some cardiomegaly, but no other findings otherwise noted.  Patient is currently asymptomatic.  Laboratory data demonstrates a mild hyponatremia and hypochloremia.  She has thus far, been given a full dose aspirin  Hospital course / Brief Summary:  She was subsequently admitted for dyspnea with exertion, 2D echocardiogram was completed and  reviewed reporting ejection fraction of 60-65%, negative for any structural abnormalities, carotid ultrasound reporting 1-49% stenosis bilateral  Follow-up labs all within normal limits, lipid panel within normal limits Lipid panel total cholesterol 173 HDL 79 LDL 76 triglyceride 91  Thyroid function TSH mildly elevated at 8.145 free T3 normal at 2.8 free T4 normal at 0.86  Presyncope -was told to be more of a vasovagal in nature, no focal neurological findings, brief normal cardiovascular workup negative  Hypertension - poorly controlled, she responded well to addition of 20 mg of lisinopril, blood pressure is much improved We have instructed the patient to keep a log of her blood pressure as she may check it few times a day presented to PCP for further adjustment and titrating medication.  Disposition-patient stable to be discharged home.  Consultations:   none  Procedures: 2D echocardiogram Bilateral carotid studies ----------------------------------------------------------------------------------------------------------------------  Discharge Instructions:   Discharge Instructions    Activity as tolerated - No restrictions   Complete by:  As directed    Diet - low sodium heart healthy   Complete by:  As directed    Discharge instructions   Complete by:  As directed    Please follow-up with your PCP in 1-2 weeks.  Recommending to check your blood sugars a few times a day with a log and present to PCP for further adjustment of medication as needed  Increase activity slowly   Complete by:  As directed        Medication List    STOP taking these medications   triamterene-hydrochlorothiazide 37.5-25 MG tablet Commonly known as:  MAXZIDE-25     TAKE these medications   aspirin 81 MG EC tablet Take 1 tablet (81 mg total) by mouth daily.   AZO-CRANBERRY PO Take 2 tablets by mouth daily as needed (uti).   DULoxetine 60 MG capsule Commonly known as:  CYMBALTA Take 60  mg by mouth 2 (two) times daily.   lisinopril 20 MG tablet Commonly known as:  PRINIVIL,ZESTRIL Take 1 tablet (20 mg total) by mouth daily.   potassium chloride 10 MEQ tablet Commonly known as:  K-DUR Take 20 mEq by mouth daily.   THERA-M Tabs Take 1 tablet by mouth daily.       Allergies  Allergen Reactions  . Latex Other (See Comments)    Causes blisters  . Sulfa Antibiotics Other (See Comments)    Made her pass out.      Procedures/Studies: US Carotid Bilateral  Result Date: 06/02/2017 CLINICAL DATA:  72 year old female with presyncope and postural dizziness EXAM: BILATERAL CAROTID DUPLEX ULTRASOUND TECHNIQUE: Pearline Cables scale imaging, color Doppler and duplex ultrasound were performed of bilateral carotid and vertebral arteries in the neck. COMPARISON:  None FINDINGS: Criteria: Quantification of carotid stenosis is based on velocity parameters that correlate the residual internal carotid diameter with NASCET-based stenosis levels, using the diameter of the distal internal carotid lumen as the denominator for stenosis measurement. The following velocity measurements were obtained: RIGHT ICA:  108/22 cm/sec CCA:  630/16 cm/sec SYSTOLIC ICA/CCA RATIO:  1.0 DIASTOLIC ICA/CCA RATIO:  1.4 ECA:  102 cm/sec LEFT ICA:  85/25 cm/sec CCA:  010/93 cm/sec SYSTOLIC ICA/CCA RATIO:  0.7 DIASTOLIC ICA/CCA RATIO:  1.1 ECA:  110 cm/sec RIGHT CAROTID ARTERY: Mild hypoechoic atherosclerotic plaque along the proximal internal carotid artery. By peak systolic velocity criteria, the estimated stenosis remains less than 50%. RIGHT VERTEBRAL ARTERY:  Patent with normal antegrade flow. LEFT CAROTID ARTERY: Trace heterogeneous atherosclerotic plaque in the proximal internal carotid artery. By peak systolic velocity criteria, the estimated stenosis is less than 50%. LEFT VERTEBRAL ARTERY:  Patent with normal antegrade flow. IMPRESSION: 1. Mild (1-49%) stenosis proximal right internal carotid artery secondary to mild  smooth hypoechoic (lipid rich) atherosclerotic plaque. 2. Mild (1-49%) stenosis proximal left internal carotid artery secondary to mild heterogeneous atherosclerotic plaque. 3. Vertebral arteries are patent with antegrade flow. Signed, Criselda Peaches, MD [facility] Radiology Electronically Signed   By: Jacqulynn Cadet M.D.   On: 06/02/2017 13:21   Dg Chest Port 1 View  Result Date: 06/02/2017 CLINICAL DATA:  Chest discomfort high blood pressure EXAM: PORTABLE CHEST 1 VIEW COMPARISON:  None. FINDINGS: No acute pulmonary infiltrate or effusion. Borderline to mild cardiomegaly. Mediastinal contour within normal limits. Aortic atherosclerosis. No pneumothorax. IMPRESSION: Borderline to mild cardiomegaly. Electronically Signed   By: Donavan Foil M.D.   On: 06/02/2017 01:38      Subjective: Patient was seen and examined 06/03/2017, 9:14 AM Patient stable  Today. No acute distress.  No issues overnight Stable for discharge.  Discharge Exam:  Vitals:   06/02/17 1800 06/02/17 2100 06/02/17 2202 06/03/17 0500  BP: (!) 154/63 (!) 152/62  103/62  Pulse: 70 73  83  Resp: 18 18  18   Temp: 97.6 F (36.4 C) 97.6 F (36.4 C)  98 F (36.7 C)  TempSrc:  Oral Oral  Oral  SpO2: 100% 97% 96% 98%  Weight: 79.8 kg (176 lb)     Height: 5\' 5"  (1.651 m)       General: Pt lying comfortably in bed & appears in no obvious distress. Cardiovascular: S1 & S2 heard, RRR, S1/S2 +. No murmurs, rubs, gallops or clicks. No JVD or pedal edema. Respiratory: Clear to auscultation without wheezing, rhonchi or crackles. No increased work of breathing. Abdominal:  Non distended, non tender & soft. No organomegaly or masses appreciated. Normal bowel sounds heard. CNS: Alert and oriented. No focal deficits. Extremities: no edema, no cyanosis    The results of significant diagnostics from this hospitalization (including imaging, microbiology, ancillary and laboratory)  are listed below for reference.     Microbiology: Recent Results (from the past 240 hour(s))  MRSA PCR Screening     Status: None   Collection Time: 06/02/17  6:26 PM  Result Value Ref Range Status   MRSA by PCR NEGATIVE NEGATIVE Final    Comment:        The GeneXpert MRSA Assay (FDA approved for NASAL specimens only), is one component of a comprehensive MRSA colonization surveillance program. It is not intended to diagnose MRSA infection nor to guide or monitor treatment for MRSA infections. Performed at [facility], [address], [place],  [address]      Labs: CBC: Recent Labs  Lab 06/02/17 0057  WBC 6.6  NEUTROABS 4.4  HGB 12.1  HCT 37.0  MCV 84.1  PLT 892   Basic Metabolic Panel: Recent Labs  Lab 06/02/17 0057  NA 133*  K 3.7  CL 95*  CO2 25  GLUCOSE 108*  BUN 15  CREATININE 0.83  CALCIUM 9.6   Liver Function Tests: Recent Labs  Lab 06/02/17 0057  AST 21  ALT 9*  ALKPHOS 75  BILITOT 0.4  PROT 8.0  ALBUMIN 4.2   BNP (last 3 results) Recent Labs    06/02/17 0057  BNP 23.0   Cardiac Enzymes: No results for input(s): CKTOTAL, CKMB, CKMBINDEX, TROPONINI in the last 168 hours. CBG: No results for input(s): GLUCAP in the last 168 hours. Hgb A1c No results for input(s): HGBA1C in the last 72 hours. Lipid Profile Recent Labs    06/02/17 0348  CHOL 173  HDL 79  LDLCALC 76  TRIG 91  CHOLHDL 2.2   Thyroid function studies Recent Labs    06/02/17 0057 06/02/17 0058  TSH 8.145*  --   T3FREE  --  2.8   Anemia work up No results for input(s): VITAMINB12, FOLATE, FERRITIN, TIBC, IRON, RETICCTPCT in the last 72 hours. Urinalysis No results found for: COLORURINE, APPEARANCEUR, LABSPEC, PHURINE, GLUCOSEU, HGBUR, BILIRUBINUR, KETONESUR, PROTEINUR, UROBILINOGEN, NITRITE, LEUKOCYTESUR    Time coordinating discharge: Over 30 minutes  SIGNED: Deatra James, MD, FACP, FHM. [facility] Pager [phone]  If 7PM-7AM,  please contact night-coverage [email or web address] Password [facility] 06/03/2017, 9:14 AM

## 2018-03-02 ENCOUNTER — Telehealth: Payer: Self-pay | Admitting: *Deleted

## 2018-03-02 NOTE — Telephone Encounter (Signed)
Called and spoke with the patient, scheduled an appt for 11/12. Patient given instructions.

## 2018-03-08 ENCOUNTER — Inpatient Hospital Stay: Payer: Medicare Other | Attending: Gynecology | Admitting: Gynecology

## 2018-03-08 ENCOUNTER — Encounter: Payer: Self-pay | Admitting: Gynecology

## 2018-03-08 VITALS — HR 80 | Temp 98.0°F | Resp 20 | Ht 65.0 in | Wt 178.0 lb

## 2018-03-08 DIAGNOSIS — Z853 Personal history of malignant neoplasm of breast: Secondary | ICD-10-CM

## 2018-03-08 DIAGNOSIS — Z923 Personal history of irradiation: Secondary | ICD-10-CM

## 2018-03-08 DIAGNOSIS — C541 Malignant neoplasm of endometrium: Secondary | ICD-10-CM

## 2018-03-08 NOTE — Patient Instructions (Signed)
Preparing for your Surgery  Plan for surgery on March 15, 2018 with Dr. Everitt Amber at [facility] will be scheduled for a robotic assisted total hysterectomy, bilateral salpingo-oophorectomy, sentinel lymph node biopsy.   Pre-operative Testing -You will receive a phone call from presurgical testing at [facility] to arrange for a pre-operative testing appointment before your surgery.  This appointment normally occurs one to two weeks before your scheduled surgery.   -Bring your insurance card, copy of an advanced directive if applicable, medication list  -At that visit, you will be asked to sign a consent for a possible blood transfusion in case a transfusion becomes necessary during surgery.  The need for a blood transfusion is rare but having consent is a necessary part of your care.     -You should not be taking blood thinners or aspirin at least ten days prior to surgery unless instructed by your surgeon.  Day Before Surgery at [facility] will be asked to take in a light diet the day before surgery.  Avoid carbonated beverages.  You will be advised to have nothing to eat or drink after midnight the evening before.    Eat a light diet the day before surgery.  Examples including soups, broths, toast, yogurt, mashed potatoes.  Things to avoid include carbonated beverages (fizzy beverages), raw fruits and raw vegetables, or beans.   If your bowels are filled with gas, your surgeon will have difficulty visualizing your pelvic organs which increases your surgical risks.  Your role in recovery Your role is to become active as soon as directed by your doctor, while still giving yourself time to heal.  Rest when you feel tired. You will be asked to do the following in order to speed your recovery:  - Cough and breathe deeply. This helps toclear and expand your lungs and can prevent pneumonia. You may be given a spirometer to practice deep breathing. A staff  member will show you how to use the spirometer. - Do mild physical activity. Walking or moving your legs help your circulation and body functions return to normal. A staff member will help you when you try to walk and will provide you with simple exercises. Do not try to get up or walk alone the first time. - Actively manage your pain. Managing your pain lets you move in comfort. We will ask you to rate your pain on a scale of zero to 10. It is your responsibility to tell your doctor or nurse where and how much you hurt so your pain can be treated.  Special Considerations -If you are diabetic, you may be placed on insulin after surgery to have closer control over your blood sugars to promote healing and recovery.  This does not mean that you will be discharged on insulin.  If applicable, your oral antidiabetics will be resumed when you are tolerating a solid diet.  -Your final pathology results from surgery should be available around one week after surgery and the results will be relayed to you when available.  -Dr. Lahoma Crocker is the Surgeon that assists your GYN Oncologist with surgery.  The next day after your surgery you will either see your GYN Oncologist, Dr. Precious Haws, or Dr. Lahoma Crocker.  -FMLA forms can be faxed to [phone] and please allow 5-7 business days for completion.   Blood Transfusion Information WHAT IS A BLOOD TRANSFUSION? A transfusion is the replacement of blood or some of its parts. Blood is made  up of multiple cells which provide different functions.  Red blood cells carry oxygen and are used for blood loss replacement.  White blood cells fight against infection.  Platelets control bleeding.  Plasma helps clot blood.  Other blood products are available for specialized needs, such as hemophilia or other clotting disorders. BEFORE THE TRANSFUSION  Who gives blood for transfusions?   You may be able to donate blood to be used at a later date on  yourself (autologous donation).  Relatives can be asked to donate blood. This is generally not any safer than if you have received blood from a stranger. The same precautions are taken to ensure safety when a relative's blood is donated.  Healthy volunteers who are fully evaluated to make sure their blood is safe. This is blood bank blood. Transfusion therapy is the safest it has ever been in the practice of medicine. Before blood is taken from a donor, a complete history is taken to make sure that person has no history of diseases nor engages in risky social behavior (examples are intravenous drug use or sexual activity with multiple partners). The donor's travel history is screened to minimize risk of transmitting infections, such as malaria. The donated blood is tested for signs of infectious diseases, such as HIV and hepatitis. The blood is then tested to be sure it is compatible with you in order to minimize the chance of a transfusion reaction. If you or a relative donates blood, this is often done in anticipation of surgery and is not appropriate for emergency situations. It takes many days to process the donated blood. RISKS AND COMPLICATIONS Although transfusion therapy is very safe and saves many lives, the main dangers of transfusion include:   Getting an infectious disease.  Developing a transfusion reaction. This is an allergic reaction to something in the blood you were given. Every precaution is taken to prevent this. The decision to have a blood transfusion has been considered carefully by your caregiver before blood is given. Blood is not given unless the benefits outweigh the risks.

## 2018-03-08 NOTE — H&P (View-Only) (Signed)
Consult Note: Gyn-Onc   Alexandra Haynes 72 y.o. female  Chief Complaint  Patient presents with  . Endometrial cancer [facility])    Assessment : High-grade endometrial carcinoma  Plan: Natural history of endometrial carcinoma was discussed the patient and her family.  I would recommend she undergo robotically assisted laparoscopic hysterectomy and sentinel node biopsies.  Risks of the procedure were reviewed.  They are also informed that given the high-grade nature of this lesion postoperative chemotherapy and/or radiation therapy would likely be recommended.  All her questions were answered.  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  They are aware that Dr. Everitt Amber will be the primary surgeon.    HPI: 72 year old white married female seen in consultation at the request of Dr. Barrie Dunker regarding management of a high-grade endometrial carcinoma.  Patient presented with post menopausal bleeding and some cramping.  Endometrial biopsy was performed revealing a high-grade endometrial carcinoma.  The patient has had a CAT scan on March 07, 2018 showing no evidence of metastatic disease and a normal size uterus.  Currently the patient is have a little bit of spotting and some cramping but otherwise no other pelvic symptoms.  She has a past history of breast cancer treated with surgery and radiation therapy approximately 6 years ago.  Review of Systems:10 point review of systems is negative except as noted in interval history.   Vitals: Pulse 80, temperature 98 F (36.7 C), temperature source Oral, resp. rate 20, height 5\' 5"  (1.651 m), weight 178 lb (80.7 kg), SpO2 100 %.  Physical Exam: General : The patient is a healthy woman in no acute distress.  HEENT: normocephalic, extraoccular movements normal; neck is supple without thyromegally  Lynphnodes: Supraclavicular and inguinal nodes not enlarged  Abdomen: Soft,  non-tender, no ascites, no organomegally, no masses, no hernias  Pelvic:  EGBUS: Normal female  Vagina: Normal, no lesions  Urethra and Bladder: Normal, non-tender  Cervix: Normal no lesions are noted. Uterus: Anterior normal shape size and consistency Bi-manual examination: Non-tender; no adenxal masses or nodularity  Rectal: normal sphincter tone, no masses, no blood  Lower extremities: No edema or varicosities. Normal range of motion      Allergies  Allergen Reactions  . Latex Other (See Comments)    Causes blisters  . Sulfa Antibiotics Other (See Comments)    Made her pass out.    History reviewed. No pertinent past medical history.  History reviewed. No pertinent surgical history.  Current Outpatient Medications  Medication Sig Dispense Refill  . aspirin EC 81 MG EC tablet Take 1 tablet (81 mg total) by mouth daily. 30 tablet 0  . AZO-CRANBERRY PO Take 2 tablets by mouth daily as needed (uti).     . DULoxetine (CYMBALTA) 60 MG capsule Take 60 mg by mouth 2 (two) times daily.     Marland Kitchen lisinopril (PRINIVIL,ZESTRIL) 20 MG tablet Take 1 tablet (20 mg total) by mouth daily. 30 tablet 0  . Multiple Vitamins-Minerals (THERA-M) TABS Take 1 tablet by mouth daily.     . potassium chloride (K-DUR) 10 MEQ tablet Take 20 mEq by mouth daily.      No current facility-administered medications for this visit.     Social History   Socioeconomic History  . Marital status: Married    Spouse name: Not on file  . Number of children: Not on file  . Years of education: Not on file  .  Highest education level: Not on file  Occupational History  . Not on file  Social Needs  . Financial resource strain: Not on file  . Food insecurity:    Worry: Not on file    Inability: Not on file  . Transportation needs:    Medical: Not on file    Non-medical: Not on file  Tobacco Use  . Smoking status: Never Smoker  . Smokeless tobacco: Never Used  Substance and Sexual Activity  . Alcohol use:  Not on file  . Drug use: Not on file  . Sexual activity: Not on file  Lifestyle  . Physical activity:    Days per week: Not on file    Minutes per session: Not on file  . Stress: Not on file  Relationships  . Social connections:    Talks on phone: Not on file    Gets together: Not on file    Attends religious service: Not on file    Active member of club or organization: Not on file    Attends meetings of clubs or organizations: Not on file    Relationship status: Not on file  . Intimate partner violence:    Fear of current or ex partner: Not on file    Emotionally abused: Not on file    Physically abused: Not on file    Forced sexual activity: Not on file  Other Topics Concern  . Not on file  Social History Narrative  . Not on file    History reviewed. No pertinent family history.    Marti Sleigh, MD 03/08/2018, 1:36 PM

## 2018-03-08 NOTE — Progress Notes (Signed)
Consult Note: Gyn-Onc   Alexandra Haynes 72 y.o. female  Chief Complaint  Patient presents with  . Endometrial cancer [facility])    Assessment : High-grade endometrial carcinoma  Plan: Natural history of endometrial carcinoma was discussed the patient and her family.  I would recommend she undergo robotically assisted laparoscopic hysterectomy and sentinel node biopsies.  Risks of the procedure were reviewed.  They are also informed that given the high-grade nature of this lesion postoperative chemotherapy and/or radiation therapy would likely be recommended.  All her questions were answered.  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  They are aware that Dr. Everitt Amber will be the primary surgeon.    HPI: 72 year old white married female seen in consultation at the request of Dr. Barrie Dunker regarding management of a high-grade endometrial carcinoma.  Patient presented with post menopausal bleeding and some cramping.  Endometrial biopsy was performed revealing a high-grade endometrial carcinoma.  The patient has had a CAT scan on March 07, 2018 showing no evidence of metastatic disease and a normal size uterus.  Currently the patient is have a little bit of spotting and some cramping but otherwise no other pelvic symptoms.  She has a past history of breast cancer treated with surgery and radiation therapy approximately 6 years ago.  Review of Systems:10 point review of systems is negative except as noted in interval history.   Vitals: Pulse 80, temperature 98 F (36.7 C), temperature source Oral, resp. rate 20, height 5\' 5"  (1.651 m), weight 178 lb (80.7 kg), SpO2 100 %.  Physical Exam: General : The patient is a healthy woman in no acute distress.  HEENT: normocephalic, extraoccular movements normal; neck is supple without thyromegally  Lynphnodes: Supraclavicular and inguinal nodes not enlarged  Abdomen: Soft,  non-tender, no ascites, no organomegally, no masses, no hernias  Pelvic:  EGBUS: Normal female  Vagina: Normal, no lesions  Urethra and Bladder: Normal, non-tender  Cervix: Normal no lesions are noted. Uterus: Anterior normal shape size and consistency Bi-manual examination: Non-tender; no adenxal masses or nodularity  Rectal: normal sphincter tone, no masses, no blood  Lower extremities: No edema or varicosities. Normal range of motion      Allergies  Allergen Reactions  . Latex Other (See Comments)    Causes blisters  . Sulfa Antibiotics Other (See Comments)    Made her pass out.    History reviewed. No pertinent past medical history.  History reviewed. No pertinent surgical history.  Current Outpatient Medications  Medication Sig Dispense Refill  . aspirin EC 81 MG EC tablet Take 1 tablet (81 mg total) by mouth daily. 30 tablet 0  . AZO-CRANBERRY PO Take 2 tablets by mouth daily as needed (uti).     . DULoxetine (CYMBALTA) 60 MG capsule Take 60 mg by mouth 2 (two) times daily.     Marland Kitchen lisinopril (PRINIVIL,ZESTRIL) 20 MG tablet Take 1 tablet (20 mg total) by mouth daily. 30 tablet 0  . Multiple Vitamins-Minerals (THERA-M) TABS Take 1 tablet by mouth daily.     . potassium chloride (K-DUR) 10 MEQ tablet Take 20 mEq by mouth daily.      No current facility-administered medications for this visit.     Social History   Socioeconomic History  . Marital status: Married    Spouse name: Not on file  . Number of children: Not on file  . Years of education: Not on file  .  Highest education level: Not on file  Occupational History  . Not on file  Social Needs  . Financial resource strain: Not on file  . Food insecurity:    Worry: Not on file    Inability: Not on file  . Transportation needs:    Medical: Not on file    Non-medical: Not on file  Tobacco Use  . Smoking status: Never Smoker  . Smokeless tobacco: Never Used  Substance and Sexual Activity  . Alcohol use:  Not on file  . Drug use: Not on file  . Sexual activity: Not on file  Lifestyle  . Physical activity:    Days per week: Not on file    Minutes per session: Not on file  . Stress: Not on file  Relationships  . Social connections:    Talks on phone: Not on file    Gets together: Not on file    Attends religious service: Not on file    Active member of club or organization: Not on file    Attends meetings of clubs or organizations: Not on file    Relationship status: Not on file  . Intimate partner violence:    Fear of current or ex partner: Not on file    Emotionally abused: Not on file    Physically abused: Not on file    Forced sexual activity: Not on file  Other Topics Concern  . Not on file  Social History Narrative  . Not on file    History reviewed. No pertinent family history.    Marti Sleigh, MD 03/08/2018, 1:36 PM

## 2018-03-09 NOTE — Progress Notes (Signed)
ekg 06-03-17 epic  cxr 06-02-17 epic ' Echo 06-02-17 epic

## 2018-03-09 NOTE — Patient Instructions (Addendum)
Alexandra Haynes  03/09/2018   Your procedure is scheduled on: 03-15-18  Report to [facility] Main  Entrance  Report to admitting at     0800  AM    Call this number if you have problems the morning of surgery [phone]   Eat a light diet the day before surgery.  Examples including soups, broths, toast, yogurt, mashed potatoes.  Things to avoid include carbonated beverages (fizzy beverages), raw fruits and raw vegetables, or beans.   If your bowels are filled with gas, your surgeon will have difficulty visualizing your pelvic organs which increases your surgical risks.  NO SOLID FOOD AFTER MIDNIGHT THE NIGHT PRIOR TO SURGERY. NOTHING BY MOUTH EXCEPT CLEAR LIQUIDS UNTIL 3 HOURS PRIOR TO [place] SURGERY. PLEASE FINISH ENSURE DRINK PER SURGEON   ORDER 3 HOURS PRIOR TO SCHEDULED SURGERY TIME WHICH NEEDS TO BE COMPLETED AT ____0730  Am then nothing by mouth________.     BRUSH YOUR TEETH MORNING OF SURGERY AND RINSE YOUR MOUTH OUT, NO CHEWING GUM CANDY OR MINTS.     Take these medicines the morning of surgery with A SIP OF WATER: cymbalta                                 You may not have any metal on your body including hair pins and              piercings  Do not wear jewelry, make-up, lotions, powders or perfumes, deodorant             Do not wear nail polish.  Do not shave  48 hours prior to surgery.      Do not bring valuables to the hospital. [place].  Contacts, dentures or bridgework may not be worn into surgery.  Leave suitcase in the car. After surgery it may be brought to your room.               Please read over the following fact sheets you were given: ___________________________________________________________________          [facility] - Preparing for Surgery Before surgery, you can play an important role.  Because skin is not sterile, your skin needs to be as free of germs as possible.   You can reduce the number of germs on your skin by washing with CHG (chlorahexidine gluconate) soap before surgery.  CHG is an antiseptic cleaner which kills germs and bonds with the skin to continue killing germs even after washing. Please DO NOT use if you have an allergy to CHG or antibacterial soaps.  If your skin becomes reddened/irritated stop using the CHG and inform your nurse when you arrive at Short Stay. Do not shave (including legs and underarms) for at least 48 hours prior to the first CHG shower.  You may shave your face/neck. Please follow these instructions carefully:  1.  Shower with CHG Soap the night before surgery and the  morning of Surgery.  2.  If you choose to wash your hair, wash your hair first as usual with your  normal  shampoo.  3.  After you shampoo, rinse your hair and body thoroughly to remove the  shampoo.  4.  Use CHG as you would any other liquid soap.  You can apply chg directly  to the skin and wash                       Gently with a scrungie or clean washcloth.  5.  Apply the CHG Soap to your body ONLY FROM THE NECK DOWN.   Do not use on face/ open                           Wound or open sores. Avoid contact with eyes, ears mouth and genitals (private parts).                       Wash face,  Genitals (private parts) with your normal soap.             6.  Wash thoroughly, paying special attention to the area where your surgery  will be performed.  7.  Thoroughly rinse your body with warm water from the neck down.  8.  DO NOT shower/wash with your normal soap after using and rinsing off  the CHG Soap.                9.  Pat yourself dry with a clean towel.            10.  Wear clean pajamas.            11.  Place clean sheets on your bed the night of your first shower and do not  sleep with pets. Day of Surgery : Do not apply any lotions/deodorants the morning of surgery.  Please wear clean clothes to the hospital/surgery  center.  FAILURE TO FOLLOW THESE INSTRUCTIONS MAY RESULT IN THE CANCELLATION OF YOUR SURGERY PATIENT SIGNATURE_________________________________  NURSE SIGNATURE__________________________________  ________________________________________________________________________  WHAT IS A BLOOD TRANSFUSION? Blood Transfusion Information  A transfusion is the replacement of blood or some of its parts. Blood is made up of multiple cells which provide different functions.  Red blood cells carry oxygen and are used for blood loss replacement.  White blood cells fight against infection.  Platelets control bleeding.  Plasma helps clot blood.  Other blood products are available for specialized needs, such as hemophilia or other clotting disorders. BEFORE THE TRANSFUSION  Who gives blood for transfusions?   Healthy volunteers who are fully evaluated to make sure their blood is safe. This is blood bank blood. Transfusion therapy is the safest it has ever been in the practice of medicine. Before blood is taken from a donor, a complete history is taken to make sure that person has no history of diseases nor engages in risky social behavior (examples are intravenous drug use or sexual activity with multiple partners). The donor's travel history is screened to minimize risk of transmitting infections, such as malaria. The donated blood is tested for signs of infectious diseases, such as HIV and hepatitis. The blood is then tested to be sure it is compatible with you in order to minimize the chance of a transfusion reaction. If you or a relative donates blood, this is often done in anticipation of surgery and is not appropriate for emergency situations. It takes many days to process the donated blood. RISKS AND COMPLICATIONS Although transfusion therapy is very safe and saves many lives, the main dangers of transfusion include:   Getting an infectious disease.  Developing a transfusion reaction. This  is an  allergic reaction to something in the blood you were given. Every precaution is taken to prevent this. The decision to have a blood transfusion has been considered carefully by your caregiver before blood is given. Blood is not given unless the benefits outweigh the risks. AFTER THE TRANSFUSION  Right after receiving a blood transfusion, you will usually feel much better and more energetic. This is especially true if your red blood cells have gotten low (anemic). The transfusion raises the level of the red blood cells which carry oxygen, and this usually causes an energy increase.  The nurse administering the transfusion will monitor you carefully for complications. HOME CARE INSTRUCTIONS  No special instructions are needed after a transfusion. You may find your energy is better. Speak with your caregiver about any limitations on activity for underlying diseases you may have. SEEK MEDICAL CARE IF:   Your condition is not improving after your transfusion.  You develop redness or irritation at the intravenous (IV) site. SEEK IMMEDIATE MEDICAL CARE IF:  Any of the following symptoms occur over the next 12 hours:  Shaking chills.  You have a temperature by mouth above 102 F (38.9 C), not controlled by medicine.  Chest, back, or muscle pain.  People around you feel you are not acting correctly or are confused.  Shortness of breath or difficulty breathing.  Dizziness and fainting.  You get a rash or develop hives.  You have a decrease in urine output.  Your urine turns a dark color or changes to pink, red, or brown. Any of the following symptoms occur over the next 10 days:  You have a temperature by mouth above 102 F (38.9 C), not controlled by medicine.  Shortness of breath.  Weakness after normal activity.  The white part of the eye turns yellow (jaundice).  You have a decrease in the amount of urine or are urinating less often.  Your urine turns a dark color or changes  to pink, red, or brown. Document Released: 04/10/2000 Document Revised: 07/06/2011 Document Reviewed: 11/28/2007 ExitCare Patient Information 2014 [place].  _______________________________________________________________________  Incentive Spirometer  An incentive spirometer is a tool that can help keep your lungs clear and active. This tool measures how well you are filling your lungs with each breath. Taking long deep breaths may help reverse or decrease the chance of developing breathing (pulmonary) problems (especially infection) following:  A long period of time when you are unable to move or be active. BEFORE THE PROCEDURE   If the spirometer includes an indicator to show your best effort, your nurse or respiratory therapist will set it to a desired goal.  If possible, sit up straight or lean slightly forward. Try not to slouch.  Hold the incentive spirometer in an upright position. INSTRUCTIONS FOR USE  1. Sit on the edge of your bed if possible, or sit up as far as you can in bed or on a chair. 2. Hold the incentive spirometer in an upright position. 3. Breathe out normally. 4. Place the mouthpiece in your mouth and seal your lips tightly around it. 5. Breathe in slowly and as deeply as possible, raising the piston or the ball toward the top of the column. 6. Hold your breath for 3-5 seconds or for as long as possible. Allow the piston or ball to fall to the bottom of the column. 7. Remove the mouthpiece from your mouth and breathe out normally. 8. Rest for a few seconds and repeat Steps 1  through 7 at least 10 times every 1-2 hours when you are awake. Take your time and take a few normal breaths between deep breaths. 9. The spirometer may include an indicator to show your best effort. Use the indicator as a goal to work toward during each repetition. 10. After each set of 10 deep breaths, practice coughing to be sure your lungs are clear. If you have an incision (the cut  made at the time of surgery), support your incision when coughing by placing a pillow or rolled up towels firmly against it. Once you are able to get out of bed, walk around indoors and cough well. You may stop using the incentive spirometer when instructed by your caregiver.  RISKS AND COMPLICATIONS  Take your time so you do not get dizzy or light-headed.  If you are in pain, you may need to take or ask for pain medication before doing incentive spirometry. It is harder to take a deep breath if you are having pain. AFTER USE  Rest and breathe slowly and easily.  It can be helpful to keep track of a log of your progress. Your caregiver can provide you with a simple table to help with this. If you are using the spirometer at home, follow these instructions: [place] IF:   You are having difficultly using the spirometer.  You have trouble using the spirometer as often as instructed.  Your pain medication is not giving enough relief while using the spirometer.  You develop fever of 100.5 F (38.1 C) or higher. SEEK IMMEDIATE MEDICAL CARE IF:   You cough up bloody sputum that had not been present before.  You develop fever of 102 F (38.9 C) or greater.  You develop worsening pain at or near the incision site. MAKE SURE YOU:   Understand these instructions.  Will watch your condition.  Will get help right away if you are not doing well or get worse. Document Released: 08/24/2006 Document Revised: 07/06/2011 Document Reviewed: 10/25/2006 [facility] Patient Information 2014 [place], [place].   ________________________________________________________________________

## 2018-03-14 ENCOUNTER — Encounter (HOSPITAL_COMMUNITY)
Admission: RE | Admit: 2018-03-14 | Discharge: 2018-03-14 | Disposition: A | Discharge status: Home / Self Care | Payer: Medicare Other | Source: Ambulatory Visit | Attending: Gynecologic Oncology | Admitting: Gynecologic Oncology

## 2018-03-14 ENCOUNTER — Other Ambulatory Visit: Payer: Self-pay

## 2018-03-14 ENCOUNTER — Encounter (HOSPITAL_COMMUNITY): Payer: Self-pay

## 2018-03-14 DIAGNOSIS — F329 Major depressive disorder, single episode, unspecified: Secondary | ICD-10-CM | POA: Diagnosis not present

## 2018-03-14 DIAGNOSIS — Z882 Allergy status to sulfonamides status: Secondary | ICD-10-CM | POA: Diagnosis not present

## 2018-03-14 DIAGNOSIS — Z923 Personal history of irradiation: Secondary | ICD-10-CM | POA: Diagnosis not present

## 2018-03-14 DIAGNOSIS — C541 Malignant neoplasm of endometrium: Secondary | ICD-10-CM | POA: Insufficient documentation

## 2018-03-14 DIAGNOSIS — M1990 Unspecified osteoarthritis, unspecified site: Secondary | ICD-10-CM | POA: Diagnosis not present

## 2018-03-14 DIAGNOSIS — G709 Myoneural disorder, unspecified: Secondary | ICD-10-CM | POA: Diagnosis not present

## 2018-03-14 DIAGNOSIS — Z79899 Other long term (current) drug therapy: Secondary | ICD-10-CM | POA: Diagnosis not present

## 2018-03-14 DIAGNOSIS — Z01812 Encounter for preprocedural laboratory examination: Secondary | ICD-10-CM | POA: Insufficient documentation

## 2018-03-14 DIAGNOSIS — D649 Anemia, unspecified: Secondary | ICD-10-CM | POA: Diagnosis not present

## 2018-03-14 DIAGNOSIS — Z7982 Long term (current) use of aspirin: Secondary | ICD-10-CM | POA: Diagnosis not present

## 2018-03-14 DIAGNOSIS — Z91040 Latex allergy status: Secondary | ICD-10-CM | POA: Diagnosis not present

## 2018-03-14 DIAGNOSIS — N950 Postmenopausal bleeding: Secondary | ICD-10-CM | POA: Diagnosis not present

## 2018-03-14 DIAGNOSIS — K219 Gastro-esophageal reflux disease without esophagitis: Secondary | ICD-10-CM | POA: Diagnosis not present

## 2018-03-14 DIAGNOSIS — F419 Anxiety disorder, unspecified: Secondary | ICD-10-CM | POA: Diagnosis not present

## 2018-03-14 DIAGNOSIS — I10 Essential (primary) hypertension: Secondary | ICD-10-CM | POA: Diagnosis not present

## 2018-03-14 DIAGNOSIS — Z853 Personal history of malignant neoplasm of breast: Secondary | ICD-10-CM | POA: Diagnosis not present

## 2018-03-14 HISTORY — DX: Depression, unspecified: F32.A

## 2018-03-14 HISTORY — DX: Pneumonia, unspecified organism: J18.9

## 2018-03-14 HISTORY — DX: Other specified postprocedural states: Z98.890

## 2018-03-14 HISTORY — DX: Other complications of anesthesia, initial encounter: T88.59XA

## 2018-03-14 HISTORY — DX: Other specified postprocedural states: R11.2

## 2018-03-14 HISTORY — DX: Adverse effect of unspecified anesthetic, initial encounter: T41.45XA

## 2018-03-14 HISTORY — DX: Major depressive disorder, single episode, unspecified: F32.9

## 2018-03-14 LAB — COMPREHENSIVE METABOLIC PANEL
ALBUMIN: 4.2 g/dL (ref 3.5–5.0)
ALK PHOS: 68 U/L (ref 38–126)
ALT: 11 U/L (ref 0–44)
AST: 27 U/L (ref 15–41)
Anion gap: 6 (ref 5–15)
BILIRUBIN TOTAL: 0.5 mg/dL (ref 0.3–1.2)
BUN: 16 mg/dL (ref 8–23)
CALCIUM: 9 mg/dL (ref 8.9–10.3)
CO2: 31 mmol/L (ref 22–32)
CREATININE: 0.82 mg/dL (ref 0.44–1.00)
Chloride: 99 mmol/L (ref 98–111)
GFR calc Af Amer: >60 mL/min (ref >60)
GFR calc non Af Amer: >60 mL/min (ref >60)
Glucose, Bld: 108 mg/dL — ABNORMAL HIGH (ref 70–99)
Potassium: 3.5 mmol/L (ref 3.5–5.1)
SODIUM: 136 mmol/L (ref 135–145)
TOTAL PROTEIN: 7.6 g/dL (ref 6.5–8.1)

## 2018-03-14 LAB — URINALYSIS, ROUTINE W REFLEX MICROSCOPIC
BACTERIA UA: NONE SEEN
Bilirubin Urine: NEGATIVE
Glucose, UA: NEGATIVE mg/dL
HGB URINE DIPSTICK: NEGATIVE
Ketones, ur: NEGATIVE mg/dL
NITRITE: NEGATIVE
PROTEIN: NEGATIVE mg/dL
SPECIFIC GRAVITY, URINE: 1.013 (ref 1.005–1.030)
pH: 7 (ref 5.0–8.0)

## 2018-03-14 LAB — CBC
HCT: 36.6 % (ref 36.0–46.0)
HEMOGLOBIN: 11.7 g/dL — AB (ref 12.0–15.0)
MCH: 27.7 pg (ref 26.0–34.0)
MCHC: 32 g/dL (ref 30.0–36.0)
MCV: 86.5 fL (ref 80.0–100.0)
Platelets: 270 10*3/uL (ref 150–400)
RBC: 4.23 MIL/uL (ref 3.87–5.11)
RDW: 13.2 % (ref 11.5–15.5)
WBC: 5.8 10*3/uL (ref 4.0–10.5)
nRBC: 0 % (ref 0.0–0.2)

## 2018-03-14 LAB — ABO/RH: ABO/RH(D): O POS

## 2018-03-14 NOTE — Progress Notes (Signed)
UA done 03-14-18 routed to Dr. Denman George via epic

## 2018-03-15 ENCOUNTER — Ambulatory Visit (HOSPITAL_COMMUNITY)
Admission: RE | Admit: 2018-03-15 | Discharge: 2018-03-16 | Disposition: A | Discharge status: Home / Self Care | Payer: Medicare Other | Source: Ambulatory Visit | Attending: Gynecologic Oncology | Admitting: Gynecologic Oncology

## 2018-03-15 ENCOUNTER — Encounter (HOSPITAL_COMMUNITY)
Admission: RE | Disposition: A | Discharge status: Home / Self Care | Payer: Self-pay | Source: Ambulatory Visit | Attending: Gynecologic Oncology

## 2018-03-15 ENCOUNTER — Ambulatory Visit (HOSPITAL_COMMUNITY): Payer: Medicare Other | Admitting: Anesthesiology

## 2018-03-15 ENCOUNTER — Other Ambulatory Visit: Payer: Self-pay

## 2018-03-15 ENCOUNTER — Encounter (HOSPITAL_COMMUNITY): Payer: Self-pay | Admitting: *Deleted

## 2018-03-15 DIAGNOSIS — G709 Myoneural disorder, unspecified: Secondary | ICD-10-CM | POA: Insufficient documentation

## 2018-03-15 DIAGNOSIS — N950 Postmenopausal bleeding: Secondary | ICD-10-CM | POA: Insufficient documentation

## 2018-03-15 DIAGNOSIS — Z882 Allergy status to sulfonamides status: Secondary | ICD-10-CM | POA: Insufficient documentation

## 2018-03-15 DIAGNOSIS — I10 Essential (primary) hypertension: Secondary | ICD-10-CM | POA: Insufficient documentation

## 2018-03-15 DIAGNOSIS — D649 Anemia, unspecified: Secondary | ICD-10-CM | POA: Insufficient documentation

## 2018-03-15 DIAGNOSIS — Z923 Personal history of irradiation: Secondary | ICD-10-CM | POA: Diagnosis not present

## 2018-03-15 DIAGNOSIS — F419 Anxiety disorder, unspecified: Secondary | ICD-10-CM | POA: Insufficient documentation

## 2018-03-15 DIAGNOSIS — M1990 Unspecified osteoarthritis, unspecified site: Secondary | ICD-10-CM | POA: Insufficient documentation

## 2018-03-15 DIAGNOSIS — K219 Gastro-esophageal reflux disease without esophagitis: Secondary | ICD-10-CM | POA: Insufficient documentation

## 2018-03-15 DIAGNOSIS — Z91040 Latex allergy status: Secondary | ICD-10-CM | POA: Insufficient documentation

## 2018-03-15 DIAGNOSIS — C541 Malignant neoplasm of endometrium: Secondary | ICD-10-CM | POA: Diagnosis present

## 2018-03-15 DIAGNOSIS — Z79899 Other long term (current) drug therapy: Secondary | ICD-10-CM | POA: Insufficient documentation

## 2018-03-15 DIAGNOSIS — F329 Major depressive disorder, single episode, unspecified: Secondary | ICD-10-CM | POA: Insufficient documentation

## 2018-03-15 DIAGNOSIS — Z853 Personal history of malignant neoplasm of breast: Secondary | ICD-10-CM | POA: Diagnosis not present

## 2018-03-15 DIAGNOSIS — Z7982 Long term (current) use of aspirin: Secondary | ICD-10-CM | POA: Insufficient documentation

## 2018-03-15 HISTORY — PX: ROBOTIC ASSISTED TOTAL HYSTERECTOMY WITH BILATERAL SALPINGO OOPHERECTOMY: SHX6086

## 2018-03-15 HISTORY — PX: SENTINEL NODE BIOPSY: SHX6608

## 2018-03-15 LAB — TYPE AND SCREEN
ABO/RH(D): O POS
Antibody Screen: NEGATIVE

## 2018-03-15 SURGERY — HYSTERECTOMY, TOTAL, ROBOT-ASSISTED, LAPAROSCOPIC, WITH BILATERAL SALPINGO-OOPHORECTOMY
Anesthesia: General

## 2018-03-15 MED ORDER — INDOCYANINE GREEN 25 MG IV SOLR
INTRAVENOUS | Status: DC | PRN
  Administered 2018-03-15: 2.5 mg

## 2018-03-15 MED ORDER — FENTANYL CITRATE (PF) 100 MCG/2ML IJ SOLN
INTRAMUSCULAR | Status: AC
  Filled 2018-03-15: qty 2

## 2018-03-15 MED ORDER — STERILE WATER FOR INJECTION IJ SOLN
INTRAMUSCULAR | Status: DC | PRN
  Administered 2018-03-15: 1 mL

## 2018-03-15 MED ORDER — PROPOFOL 10 MG/ML IV BOLUS
INTRAVENOUS | Status: DC | PRN
  Administered 2018-03-15: 30 mg via INTRAVENOUS
  Administered 2018-03-15: 150 mg via INTRAVENOUS

## 2018-03-15 MED ORDER — MIDAZOLAM HCL 5 MG/5ML IJ SOLN
INTRAMUSCULAR | Status: DC | PRN
  Administered 2018-03-15 (×2): 1 mg via INTRAVENOUS

## 2018-03-15 MED ORDER — ASPIRIN EC 81 MG PO TBEC
81.0000 mg | DELAYED_RELEASE_TABLET | Freq: Every day | ORAL | Status: DC
  Administered 2018-03-16: 81 mg via ORAL
  Filled 2018-03-15: qty 1

## 2018-03-15 MED ORDER — LACTATED RINGERS IV SOLN
INTRAVENOUS | Status: DC
  Administered 2018-03-15 (×2): via INTRAVENOUS

## 2018-03-15 MED ORDER — LOSARTAN POTASSIUM 25 MG PO TABS
25.0000 mg | ORAL_TABLET | Freq: Every day | ORAL | Status: DC
  Administered 2018-03-16: 25 mg via ORAL
  Filled 2018-03-15: qty 1

## 2018-03-15 MED ORDER — HYDRALAZINE HCL 20 MG/ML IJ SOLN
INTRAMUSCULAR | Status: DC | PRN
  Administered 2018-03-15: 2.5 mg via INTRAVENOUS
  Administered 2018-03-15: 5 mg via INTRAVENOUS
  Administered 2018-03-15: 2.5 mg via INTRAVENOUS

## 2018-03-15 MED ORDER — ROCURONIUM BROMIDE 100 MG/10ML IV SOLN
INTRAVENOUS | Status: DC | PRN
  Administered 2018-03-15: 10 mg via INTRAVENOUS
  Administered 2018-03-15: 20 mg via INTRAVENOUS
  Administered 2018-03-15: 50 mg via INTRAVENOUS

## 2018-03-15 MED ORDER — FENTANYL CITRATE (PF) 100 MCG/2ML IJ SOLN
25.0000 ug | INTRAMUSCULAR | Status: DC | PRN
  Administered 2018-03-15 (×3): 50 ug via INTRAVENOUS

## 2018-03-15 MED ORDER — FENTANYL CITRATE (PF) 250 MCG/5ML IJ SOLN
INTRAMUSCULAR | Status: AC
  Filled 2018-03-15: qty 5

## 2018-03-15 MED ORDER — LACTATED RINGERS IR SOLN
Status: DC | PRN
  Administered 2018-03-15: 1000 mL

## 2018-03-15 MED ORDER — SUGAMMADEX SODIUM 200 MG/2ML IV SOLN
INTRAVENOUS | Status: DC | PRN
  Administered 2018-03-15: 300 mg via INTRAVENOUS

## 2018-03-15 MED ORDER — ESMOLOL HCL 100 MG/10ML IV SOLN
INTRAVENOUS | Status: DC | PRN
  Administered 2018-03-15: 20 mg via INTRAVENOUS

## 2018-03-15 MED ORDER — GABAPENTIN 300 MG PO CAPS
300.0000 mg | ORAL_CAPSULE | ORAL | Status: AC
  Administered 2018-03-15: 300 mg via ORAL
  Filled 2018-03-15: qty 1

## 2018-03-15 MED ORDER — LIDOCAINE HCL (CARDIAC) PF 100 MG/5ML IV SOSY
PREFILLED_SYRINGE | INTRAVENOUS | Status: DC | PRN
  Administered 2018-03-15: 30 mg via INTRAVENOUS

## 2018-03-15 MED ORDER — LIDOCAINE 2% (20 MG/ML) 5 ML SYRINGE
INTRAMUSCULAR | Status: AC
  Filled 2018-03-15: qty 5

## 2018-03-15 MED ORDER — OXYCODONE HCL 5 MG PO TABS
5.0000 mg | ORAL_TABLET | ORAL | Status: DC | PRN
  Administered 2018-03-15 – 2018-03-16 (×2): 5 mg via ORAL
  Filled 2018-03-15 (×2): qty 1

## 2018-03-15 MED ORDER — STERILE WATER FOR INJECTION IJ SOLN
INTRAMUSCULAR | Status: AC
  Filled 2018-03-15: qty 10

## 2018-03-15 MED ORDER — FENTANYL CITRATE (PF) 100 MCG/2ML IJ SOLN
INTRAMUSCULAR | Status: DC | PRN
  Administered 2018-03-15: 25 ug via INTRAVENOUS
  Administered 2018-03-15 (×2): 50 ug via INTRAVENOUS
  Administered 2018-03-15: 25 ug via INTRAVENOUS
  Administered 2018-03-15: 50 ug via INTRAVENOUS
  Administered 2018-03-15: 25 ug via INTRAVENOUS
  Administered 2018-03-15 (×4): 50 ug via INTRAVENOUS
  Administered 2018-03-15: 75 ug via INTRAVENOUS

## 2018-03-15 MED ORDER — IBUPROFEN 400 MG PO TABS
600.0000 mg | ORAL_TABLET | Freq: Four times a day (QID) | ORAL | Status: DC
  Administered 2018-03-16: 600 mg via ORAL
  Filled 2018-03-15: qty 1

## 2018-03-15 MED ORDER — HYDROMORPHONE HCL 1 MG/ML IJ SOLN: 0.5000 mg | INTRAMUSCULAR | Status: DC | PRN

## 2018-03-15 MED ORDER — ONDANSETRON HCL 4 MG/2ML IJ SOLN
4.0000 mg | Freq: Four times a day (QID) | INTRAMUSCULAR | Status: DC | PRN
  Administered 2018-03-15: 4 mg via INTRAVENOUS
  Filled 2018-03-15: qty 2

## 2018-03-15 MED ORDER — KCL IN DEXTROSE-NACL 20-5-0.45 MEQ/L-%-% IV SOLN
INTRAVENOUS | Status: DC
  Administered 2018-03-15 – 2018-03-16 (×2): via INTRAVENOUS
  Filled 2018-03-15 (×3): qty 1000

## 2018-03-15 MED ORDER — HYDRALAZINE HCL 20 MG/ML IJ SOLN
INTRAMUSCULAR | Status: AC
  Filled 2018-03-15: qty 1

## 2018-03-15 MED ORDER — ENOXAPARIN SODIUM 40 MG/0.4ML ~~LOC~~ SOLN
40.0000 mg | SUBCUTANEOUS | Status: DC
  Administered 2018-03-16: 40 mg via SUBCUTANEOUS
  Filled 2018-03-15: qty 0.4

## 2018-03-15 MED ORDER — SUGAMMADEX SODIUM 200 MG/2ML IV SOLN
INTRAVENOUS | Status: AC
  Filled 2018-03-15: qty 2

## 2018-03-15 MED ORDER — PROPOFOL 10 MG/ML IV BOLUS
INTRAVENOUS | Status: AC
  Filled 2018-03-15: qty 20

## 2018-03-15 MED ORDER — MIDAZOLAM HCL 2 MG/2ML IJ SOLN
INTRAMUSCULAR | Status: AC
  Filled 2018-03-15: qty 2

## 2018-03-15 MED ORDER — ENOXAPARIN SODIUM 40 MG/0.4ML ~~LOC~~ SOLN
40.0000 mg | SUBCUTANEOUS | Status: AC
  Administered 2018-03-15: 40 mg via SUBCUTANEOUS
  Filled 2018-03-15: qty 0.4

## 2018-03-15 MED ORDER — ONDANSETRON HCL 4 MG PO TABS: 4.0000 mg | ORAL_TABLET | Freq: Four times a day (QID) | ORAL | Status: DC | PRN

## 2018-03-15 MED ORDER — DEXAMETHASONE SODIUM PHOSPHATE 4 MG/ML IJ SOLN: 4.0000 mg | INTRAMUSCULAR | Status: DC

## 2018-03-15 MED ORDER — ONDANSETRON HCL 4 MG/2ML IJ SOLN
INTRAMUSCULAR | Status: AC
  Filled 2018-03-15: qty 2

## 2018-03-15 MED ORDER — ROCURONIUM BROMIDE 100 MG/10ML IV SOLN
INTRAVENOUS | Status: AC
  Filled 2018-03-15: qty 1

## 2018-03-15 MED ORDER — TRAMADOL HCL 50 MG PO TABS
100.0000 mg | ORAL_TABLET | Freq: Two times a day (BID) | ORAL | Status: DC | PRN
  Administered 2018-03-16: 50 mg via ORAL
  Filled 2018-03-15: qty 2

## 2018-03-15 MED ORDER — SCOPOLAMINE 1 MG/3DAYS TD PT72
1.0000 | MEDICATED_PATCH | TRANSDERMAL | Status: DC
  Filled 2018-03-15: qty 1

## 2018-03-15 MED ORDER — ONDANSETRON HCL 4 MG/2ML IJ SOLN
INTRAMUSCULAR | Status: DC | PRN
  Administered 2018-03-15: 4 mg via INTRAVENOUS

## 2018-03-15 MED ORDER — DEXAMETHASONE SODIUM PHOSPHATE 10 MG/ML IJ SOLN
INTRAMUSCULAR | Status: AC
  Filled 2018-03-15: qty 1

## 2018-03-15 MED ORDER — SENNOSIDES-DOCUSATE SODIUM 8.6-50 MG PO TABS
2.0000 | ORAL_TABLET | Freq: Every day | ORAL | Status: DC
  Administered 2018-03-15: 2 via ORAL
  Filled 2018-03-15: qty 2

## 2018-03-15 MED ORDER — ACETAMINOPHEN 500 MG PO TABS
1000.0000 mg | ORAL_TABLET | Freq: Two times a day (BID) | ORAL | Status: DC
  Administered 2018-03-15 – 2018-03-16 (×2): 1000 mg via ORAL
  Filled 2018-03-15 (×2): qty 2

## 2018-03-15 MED ORDER — ONDANSETRON HCL 4 MG/2ML IJ SOLN: 4.0000 mg | Freq: Once | INTRAMUSCULAR | Status: DC | PRN

## 2018-03-15 MED ORDER — ACETAMINOPHEN 500 MG PO TABS
1000.0000 mg | ORAL_TABLET | ORAL | Status: AC
  Administered 2018-03-15: 1000 mg via ORAL
  Filled 2018-03-15: qty 2

## 2018-03-15 MED ORDER — CEFAZOLIN SODIUM-DEXTROSE 2-4 GM/100ML-% IV SOLN
2.0000 g | INTRAVENOUS | Status: AC
  Administered 2018-03-15: 2 g via INTRAVENOUS
  Filled 2018-03-15: qty 100

## 2018-03-15 MED ORDER — EPHEDRINE SULFATE 50 MG/ML IJ SOLN
INTRAMUSCULAR | Status: DC | PRN
  Administered 2018-03-15: 10 mg via INTRAVENOUS

## 2018-03-15 SURGICAL SUPPLY — 54 items
APPLICATOR SURGIFLO ENDO (HEMOSTASIS) IMPLANT
BAG LAPAROSCOPIC 12 15 PORT 16 (BASKET) IMPLANT
BAG RETRIEVAL 12/15 (BASKET)
BAG RETRIEVAL 12/15MM (BASKET)
COVER BACK TABLE 60X90IN (DRAPES) ×3 IMPLANT
COVER TIP SHEARS 8 DVNC (MISCELLANEOUS) ×1 IMPLANT
COVER TIP SHEARS 8MM DA VINCI (MISCELLANEOUS) ×2
COVER WAND RF STERILE (DRAPES) ×3 IMPLANT
DERMABOND ADVANCED (GAUZE/BANDAGES/DRESSINGS) ×2
DERMABOND ADVANCED .7 DNX12 (GAUZE/BANDAGES/DRESSINGS) ×1 IMPLANT
DRAPE ARM DVNC X/XI (DISPOSABLE) ×4 IMPLANT
DRAPE COLUMN DVNC XI (DISPOSABLE) ×1 IMPLANT
DRAPE DA VINCI XI ARM (DISPOSABLE) ×8
DRAPE DA VINCI XI COLUMN (DISPOSABLE) ×2
DRAPE SHEET LG 3/4 BI-LAMINATE (DRAPES) ×3 IMPLANT
DRAPE SURG IRRIG POUCH 19X23 (DRAPES) ×3 IMPLANT
ELECT REM PT RETURN 15FT ADLT (MISCELLANEOUS) ×3 IMPLANT
GAUZE 4X4 16PLY RFD (DISPOSABLE) ×3 IMPLANT
GLOVE BIO SURGEON STRL SZ 6 (GLOVE) IMPLANT
GLOVE BIO SURGEON STRL SZ 6.5 (GLOVE) IMPLANT
GLOVE BIO SURGEONS STRL SZ 6.5 (GLOVE)
GLOVE SURG SS PI 6.0 STRL IVOR (GLOVE) ×12 IMPLANT
GLOVE SURG SS PI 6.5 STRL IVOR (GLOVE) ×6 IMPLANT
GOWN STRL REUS W/ TWL LRG LVL3 (GOWN DISPOSABLE) ×2 IMPLANT
GOWN STRL REUS W/TWL LRG LVL3 (GOWN DISPOSABLE) ×4
HOLDER FOLEY CATH W/STRAP (MISCELLANEOUS) ×3 IMPLANT
IRRIG SUCT STRYKERFLOW 2 WTIP (MISCELLANEOUS) ×3
IRRIGATION SUCT STRKRFLW 2 WTP (MISCELLANEOUS) ×1 IMPLANT
KIT PROCEDURE DA VINCI SI (MISCELLANEOUS) ×2
KIT PROCEDURE DVNC SI (MISCELLANEOUS) ×1 IMPLANT
MANIPULATOR UTERINE 4.5 ZUMI (MISCELLANEOUS) ×3 IMPLANT
NEEDLE SPNL 18GX3.5 QUINCKE PK (NEEDLE) ×3 IMPLANT
OBTURATOR OPTICAL STANDARD 8MM (TROCAR) ×2
OBTURATOR OPTICAL STND 8 DVNC (TROCAR) ×1
OBTURATOR OPTICALSTD 8 DVNC (TROCAR) ×1 IMPLANT
PACK ROBOT GYN CUSTOM WL (TRAY / TRAY PROCEDURE) ×3 IMPLANT
PAD POSITIONING PINK XL (MISCELLANEOUS) ×3 IMPLANT
PORT ACCESS TROCAR AIRSEAL 12 (TROCAR) ×1 IMPLANT
PORT ACCESS TROCAR AIRSEAL 5M (TROCAR) ×2
POUCH SPECIMEN RETRIEVAL 10MM (ENDOMECHANICALS) IMPLANT
SEAL CANN UNIV 5-8 DVNC XI (MISCELLANEOUS) ×4 IMPLANT
SEAL XI 5MM-8MM UNIVERSAL (MISCELLANEOUS) ×8
SET TRI-LUMEN FLTR TB AIRSEAL (TUBING) ×3 IMPLANT
SURGIFLO W/THROMBIN 8M KIT (HEMOSTASIS) IMPLANT
SUT MNCRL AB 4-0 PS2 18 (SUTURE) ×3 IMPLANT
SUT VIC AB 0 CT1 27 (SUTURE)
SUT VIC AB 0 CT1 27XBRD ANTBC (SUTURE) IMPLANT
SYR 10ML LL (SYRINGE) ×3 IMPLANT
TOWEL OR NON WOVEN STRL DISP B (DISPOSABLE) ×3 IMPLANT
TRAP SPECIMEN MUCOUS 40CC (MISCELLANEOUS) IMPLANT
TRAY FOLEY METER SIL LF 16FR (CATHETERS) ×3 IMPLANT
TRAY FOLEY MTR SLVR 16FR STAT (SET/KITS/TRAYS/PACK) IMPLANT
UNDERPAD 30X30 (UNDERPADS AND DIAPERS) ×3 IMPLANT
WATER STERILE IRR 1000ML POUR (IV SOLUTION) ×3 IMPLANT

## 2018-03-15 NOTE — Anesthesia Procedure Notes (Signed)
Procedure Name: Intubation Date/Time: 03/15/2018 11:36 AM Performed by: Garrel Ridgel, CRNA Pre-anesthesia Checklist: Patient identified, Emergency Drugs available, Suction available, Patient being monitored and Timeout performed Patient Re-evaluated:Patient Re-evaluated prior to induction Oxygen Delivery Method: Circle system utilized Preoxygenation: Pre-oxygenation with 100% oxygen Induction Type: IV induction Ventilation: Mask ventilation without difficulty Laryngoscope Size: Mac and 3 Grade View: Grade II Tube size: 7.0 mm Number of attempts: 1 Airway Equipment and Method: Stylet Secured at: 22 cm Tube secured with: Tape Dental Injury: Teeth and Oropharynx as per pre-operative assessment

## 2018-03-15 NOTE — Op Note (Addendum)
OPERATIVE NOTE 03/15/18  Surgeon: Donaciano Eva   Assistants: Dr Lahoma Crocker (an MD assistant was necessary for tissue manipulation, management of robotic instrumentation, retraction and positioning due to the complexity of the case and hospital policies).   Anesthesia: General endotracheal anesthesia  ASA Class: 3   Pre-operative Diagnosis: endometrial cancer grade 3  Post-operative Diagnosis: same,   Operation: Robotic-assisted laparoscopic total hysterectomy with bilateral salpingoophorectomy, SLN biopsy, vaginal biopsy  Surgeon: Donaciano Eva  Assistant Surgeon: Lahoma Crocker MD  Anesthesia: GET  Urine Output: 300cc  Operative Findings:  : 6cm grossly normal uterus, adhesions between cecum and anterior abdominal wall. No suspicious nodes. 2cm exophytic tongue of vaginal mucosa from the right upper lateral vagina (biopsy negative for maligancy).   Estimated Blood Loss:  less than 50 mL      Total IV Fluids: 900 ml         Specimens: uterus, cervix, bilateral tubes and ovaries, right lateral external iliac SLN, right medial external iliac SLN, right obturator SLN, left obturator SLN, left common iliac SLN. Right vaginal wall biopsy.          Complications:  None; patient tolerated the procedure well.         Disposition: PACU - hemodynamically stable.  Procedure Details  The patient was seen in the Holding Room. The risks, benefits, complications, treatment options, and expected outcomes were discussed with the patient.  The patient concurred with the proposed plan, giving informed consent.  The site of surgery properly noted/marked. The patient was identified as Alexandra Haynes and the procedure verified as a Robotic-assisted hysterectomy with bilateral salpingo oophorectomy with SLN biopsy. A Time Out was held and the above information confirmed.  After induction of anesthesia, the patient was draped and prepped in the usual sterile manner. Pt was  placed in supine position after anesthesia and draped and prepped in the usual sterile manner. The abdominal drape was placed after the CholoraPrep had been allowed to dry for 3 minutes.  Her arms were tucked to her side with all appropriate precautions.  The shoulders were stabilized with padded shoulder blocks applied to the acromium processes.  The patient was placed in the semi-lithotomy position in Novelty.  The perineum was prepped with Betadine. The patient was then prepped. Foley catheter was placed.  A sterile speculum was placed in the vagina. A 2cm thin tongue of vaginal mucosa was seen projecting from the upper right vaginal sidewall. An 11 blade scalpel was used to excise a portion (not completely) of this lesion. Frozen section was sent and was noted to be benign.   The cervix was grasped with a single-tooth tenaculum. 2mg  total of ICG was injected into the cervical stroma at 2 and 9 o'clock with 1cc injected at a 1cm and 8mm depth (concentration 0.5mg /ml) in all locations. The cervix was dilated with Kennon Rounds dilators.  The ZUMI uterine manipulator with a medium colpotomizer ring was placed without difficulty.  A pneum occluder balloon was placed over the manipulator.  OG tube placement was confirmed and to suction.   Next, a 5 mm skin incision was made 1 cm below the subcostal margin in the midclavicular line.  The 5 mm Optiview port and scope was used for direct entry.  Opening pressure was under 10 mm CO2.  The abdomen was insufflated and the findings were noted as above.   At this point and all points during the procedure, the patient's intra-abdominal pressure did not exceed 15 mmHg.  Next, a 10 mm skin incision was made in the umbilicus and a right and left port was placed about 10 cm lateral to the robot port on the right and left side.  A fourth arm was placed in the left lower quadrant 2 cm above and superior and medial to the anterior superior iliac spine.  All ports were placed under  direct visualization.  The patient was placed in steep Trendelenburg.  Bowel was folded away into the upper abdomen.  The robot was docked in the normal manner.  The cecal adhesions to the anterior abdominal wall were taken down with sharp dissection without issue. The right and left peritoneum were opened parallel to the IP ligament to open the retroperitoneal spaces bilaterally. The SLN mapping was performed in bilateral pelvic basins. The para rectal and paravesical spaces were opened up entirely with careful dissection below the level of the ureters bilaterally and to the depth of the uterine artery origin in order to skeletonize the uterine "web" and ensure visualization of all parametrial channels. The para-aortic basins were carefully exposed and evaluated for isolated para-aortic SLN's. Lymphatic channels were identified travelling to the following visualized sentinel lymph node's: right external iliac (lateral and medial), right obturator, left obturator and left common iliac. These SLN's were separated from their surrounding lymphatic tissue, removed and sent for permanent pathology.  The hysterectomy was started after the round ligament on the right side was incised and the retroperitoneum was entered and the pararectal space was developed.  The ureter was noted to be on the medial leaf of the broad ligament.  The peritoneum above the ureter was incised and stretched and the infundibulopelvic ligament was skeletonized, cauterized and cut.  The posterior peritoneum was taken down to the level of the KOH ring.  The anterior peritoneum was also taken down.  The bladder flap was created to the level of the KOH ring.  The uterine artery on the right side was skeletonized, cauterized and cut in the normal manner.  A similar procedure was performed on the left.  The colpotomy was made and the uterus, cervix, bilateral ovaries and tubes were amputated and delivered through the vagina.  Pedicles were inspected  and excellent hemostasis was achieved.    The colpotomy at the vaginal cuff was closed with Vicryl on a CT1 needle in a running manner.  Irrigation was used and excellent hemostasis was achieved.  At this point in the procedure was completed.  Robotic instruments were removed under direct visulaization.  The robot was undocked. The 10 mm ports were closed with Vicryl on a UR-5 needle and the fascia was closed with 0 Vicryl on a UR-5 needle.  The skin was closed with 4-0 Vicryl in a subcuticular manner.  Dermabond was applied.  Sponge, lap and needle counts correct x 2.  The patient was taken to the recovery room in stable condition.  The vagina was swabbed with  minimal bleeding noted.   All instrument and needle counts were correct x  3.   The patient was transferred to the recovery room in a stable condition.  Donaciano Eva, MD

## 2018-03-15 NOTE — Transfer of Care (Signed)
Immediate Anesthesia Transfer of Care Note  Patient: Alexandra Haynes  Procedure(s) Performed: XI ROBOTIC ASSISTED TOTAL HYSTERECTOMY WITH BILATERAL SALPINGO OOPHORECTOMY WITH VAGINAL WALL BIOPSY (N/A ) SENTINEL NODE BIOPSY (N/A )  Patient Location: PACU  Anesthesia Type:General  Level of Consciousness: awake, alert  and oriented  Airway & Oxygen Therapy: Patient Spontanous Breathing and Patient connected to face mask oxygen  Post-op Assessment: Report given to RN and Post -op Vital signs reviewed and stable  Post vital signs: Reviewed and stable  Last Vitals:  Vitals Value Taken Time  BP 133/55 03/15/2018  1:48 PM  Temp    Pulse 86 03/15/2018  1:52 PM  Resp 20 03/15/2018  1:52 PM  SpO2 100 % 03/15/2018  1:52 PM  Vitals shown include unvalidated device data.  Last Pain:  Vitals:   03/15/18 0808  TempSrc: Oral      Patients Stated Pain Goal: 4 (16/10/96 0454)  Complications: No apparent anesthesia complications

## 2018-03-15 NOTE — Anesthesia Preprocedure Evaluation (Addendum)
Anesthesia Evaluation  Patient identified by MRN, date of birth, ID band Patient awake    Reviewed: Allergy & Precautions, NPO status , Patient's Chart, lab work & pertinent test results  History of Anesthesia Complications (+) PONV and history of anesthetic complications  Airway Mallampati: II  TM Distance: >3 FB Neck ROM: Full    Dental  (+) Teeth Intact, Dental Advisory Given   Pulmonary neg pulmonary ROS,    Pulmonary exam normal breath sounds clear to auscultation       Cardiovascular hypertension, Pt. on medications Normal cardiovascular exam Rhythm:Regular Rate:Normal     Neuro/Psych PSYCHIATRIC DISORDERS Anxiety Depression  Neuromuscular disease    GI/Hepatic Neg liver ROS, GERD  ,  Endo/Other  negative endocrine ROS  Renal/GU negative Renal ROS     Musculoskeletal  (+) Arthritis ,   Abdominal   Peds  Hematology  (+) Blood dyscrasia, anemia ,   Anesthesia Other Findings Day of surgery medications reviewed with the patient.  H/o breast cancer   Reproductive/Obstetrics Endometrial cancer                             Anesthesia Physical Anesthesia Plan  ASA: III  Anesthesia Plan: General   Post-op Pain Management:    Induction: Intravenous  PONV Risk Score and Plan: 4 or greater and Dexamethasone, Ondansetron, Propofol infusion and Treatment may vary due to age or medical condition  Airway Management Planned: Oral ETT  Additional Equipment:   Intra-op Plan:   Post-operative Plan: Extubation in OR  Informed Consent: I have reviewed the patients History and Physical, chart, labs and discussed the procedure including the risks, benefits and alternatives for the proposed anesthesia with the patient or authorized representative who has indicated his/her understanding and acceptance.   Dental advisory given  Plan Discussed with: CRNA  Anesthesia Plan Comments: (2nd IV  after induction)        Anesthesia Quick Evaluation

## 2018-03-15 NOTE — Interval H&P Note (Signed)
History and Physical Interval Note:  03/15/2018 10:29 AM  Sanjuan Dame A Flight  has presented today for surgery, with the diagnosis of ENDOMETRIAL CANCER  The various methods of treatment have been discussed with the patient and family. After consideration of risks, benefits and other options for treatment, the patient has consented to  Procedure(s): XI ROBOTIC ASSISTED TOTAL HYSTERECTOMY WITH BILATERAL SALPINGO OOPHORECTOMY (N/A) SENTINEL NODE BIOPSY (N/A) as a surgical intervention .  The patient's history has been reviewed, patient examined, no change in status, stable for surgery.  I have reviewed the patient's chart and labs.  Questions were answered to the patient's satisfaction.     Thereasa Solo

## 2018-03-15 NOTE — Anesthesia Postprocedure Evaluation (Signed)
Anesthesia Post Note  Patient: Alexandra Haynes  Procedure(s) Performed: XI ROBOTIC ASSISTED TOTAL HYSTERECTOMY WITH BILATERAL SALPINGO OOPHORECTOMY WITH VAGINAL WALL BIOPSY (N/A ) SENTINEL NODE BIOPSY (N/A )     Patient location during evaluation: PACU Anesthesia Type: General Level of consciousness: awake and alert, awake and oriented Pain management: pain level controlled Vital Signs Assessment: post-procedure vital signs reviewed and stable Respiratory status: spontaneous breathing, nonlabored ventilation and respiratory function stable Cardiovascular status: blood pressure returned to baseline and stable Postop Assessment: no apparent nausea or vomiting Anesthetic complications: no    Last Vitals:  Vitals:   03/15/18 1835 03/15/18 1913  BP: (!) 104/55 (!) 120/59  Pulse: 96 87  Resp: 18 17  Temp: 36.6 C 36.9 C  SpO2: 97% 99%    Last Pain:  Vitals:   03/15/18 1913  TempSrc: Oral  PainSc:                  Catalina Gravel

## 2018-03-16 ENCOUNTER — Encounter (HOSPITAL_COMMUNITY): Payer: Self-pay | Admitting: Gynecologic Oncology

## 2018-03-16 ENCOUNTER — Other Ambulatory Visit: Payer: Self-pay | Admitting: Gynecologic Oncology

## 2018-03-16 DIAGNOSIS — R7989 Other specified abnormal findings of blood chemistry: Secondary | ICD-10-CM

## 2018-03-16 DIAGNOSIS — C541 Malignant neoplasm of endometrium: Secondary | ICD-10-CM | POA: Diagnosis not present

## 2018-03-16 LAB — CBC
HEMATOCRIT: 30.3 % — AB (ref 36.0–46.0)
Hemoglobin: 9.6 g/dL — ABNORMAL LOW (ref 12.0–15.0)
MCH: 28.5 pg (ref 26.0–34.0)
MCHC: 31.7 g/dL (ref 30.0–36.0)
MCV: 89.9 fL (ref 80.0–100.0)
NRBC: 0 % (ref 0.0–0.2)
Platelets: 230 10*3/uL (ref 150–400)
RBC: 3.37 MIL/uL — AB (ref 3.87–5.11)
RDW: 13.7 % (ref 11.5–15.5)
WBC: 8.8 10*3/uL (ref 4.0–10.5)

## 2018-03-16 LAB — BASIC METABOLIC PANEL
ANION GAP: 8 (ref 5–15)
BUN: 19 mg/dL (ref 8–23)
CO2: 25 mmol/L (ref 22–32)
Calcium: 8.1 mg/dL — ABNORMAL LOW (ref 8.9–10.3)
Chloride: 97 mmol/L — ABNORMAL LOW (ref 98–111)
Creatinine, Ser: 1.18 mg/dL — ABNORMAL HIGH (ref 0.44–1.00)
GFR calc non Af Amer: 44 mL/min — ABNORMAL LOW (ref >60)
GFR, EST AFRICAN AMERICAN: 51 mL/min — AB (ref >60)
GLUCOSE: 184 mg/dL — AB (ref 70–99)
POTASSIUM: 4.4 mmol/L (ref 3.5–5.1)
Sodium: 130 mmol/L — ABNORMAL LOW (ref 135–145)

## 2018-03-16 MED ORDER — TRAMADOL HCL 50 MG PO TABS: 50.0000 mg | ORAL_TABLET | Freq: Four times a day (QID) | ORAL | 0 refills | Status: DC | PRN

## 2018-03-16 MED ORDER — SENNOSIDES-DOCUSATE SODIUM 8.6-50 MG PO TABS: 2.0000 | ORAL_TABLET | Freq: Every day | ORAL | 1 refills | Status: DC

## 2018-03-16 NOTE — Discharge Instructions (Signed)
03/16/2018  Return to work: 4-6 weeks if applicable  Activity: 1. Be up and out of the bed during the day.  Take a nap if needed.  You may walk up steps but be careful and use the hand rail.  Stair climbing will tire you more than you think, you may need to stop part way and rest.   2. No lifting or straining for 6 weeks.  3. No driving for 1 week(s).  Do not drive if you are taking narcotic pain medicine.  4. Shower daily.  Use soap and water on your incision and pat dry; don't rub.  No tub baths until cleared by your surgeon.   5. No sexual activity and nothing in the vagina for 8 weeks.  6. You may experience a small amount of clear drainage from your incisions, which is normal.  If the drainage persists or increases, please call the office.  7. You may experience vaginal spotting after surgery or around the 6-8 week mark from surgery when the stitches at the top of the vagina begin to dissolve.  The spotting is normal but if you experience heavy bleeding, call our office.  8. Take Tylenol first for pain and only use tramadol for severe pain not relieved by the Tylenol.  Monitor your Tylenol intake to a max of 4,000 mg a day.  Diet: 1. Low sodium Heart Healthy Diet is recommended.  2. It is safe to use a laxative, such as Miralax or Colace, if you have difficulty moving your bowels. You can take Sennakot at bedtime every evening to keep bowel movements regular and to prevent constipation.    Wound Care: 1. Keep clean and dry.  Shower daily.  Reasons to call the Doctor:  Fever - Oral temperature greater than 100.4 degrees Fahrenheit  Foul-smelling vaginal discharge  Difficulty urinating  Nausea and vomiting  Increased pain at the site of the incision that is unrelieved with pain medicine.  Difficulty breathing with or without chest pain  New calf pain especially if only on one side  Sudden, continuing increased vaginal bleeding with or without clots.   Contacts: For  questions or concerns you should contact:  Dr. Everitt Amber at [phone]  Joylene John, NP at [phone]  After Hours: call [phone] and have the GYN Oncologist paged/contacted  Tramadol tablets What is this medicine? TRAMADOL (TRA ma dole) is a pain reliever. It is used to treat moderate to severe pain in adults. This medicine may be used for other purposes; ask your health care provider or pharmacist if you have questions. COMMON BRAND NAME(S): Ultram What should I tell my health care provider before I take this medicine? They need to know if you have any of these conditions: -brain tumor -depression -drug abuse or addiction -head injury -if you frequently drink alcohol containing drinks -kidney disease or trouble passing urine -liver disease -lung disease, asthma, or breathing problems -seizures or epilepsy -suicidal thoughts, plans, or attempt; a previous suicide attempt by you or a family member -an unusual or allergic reaction to tramadol, codeine, other medicines, foods, dyes, or preservatives -pregnant or trying to get pregnant -breast-feeding How should I use this medicine? Take this medicine by mouth with a full glass of water. Follow the directions on the prescription label. You can take it with or without food. If it upsets your stomach, take it with food. Do not take your medicine more often than directed. A special MedGuide will be given to you by the pharmacist  with each prescription and refill. Be sure to read this information carefully each time. Talk to your pediatrician regarding the use of this medicine in children. Special care may be needed. Overdosage: If you think you have taken too much of this medicine contact a poison control center or emergency room at once. NOTE: This medicine is only for you. Do not share this medicine with others. What if I miss a dose? If you miss a dose, take it as soon as you can. If it is almost time for your next dose,  take only that dose. Do not take double or extra doses. What may interact with this medicine? Do not take this medication with any of the following medicines: -MAOIs like Carbex, Eldepryl, Marplan, Nardil, and Parnate This medicine may also interact with the following medications: -alcohol -antihistamines for allergy, cough and cold -certain medicines for anxiety or sleep -certain medicines for depression like amitriptyline, fluoxetine, sertraline -certain medicines for migraine headache like almotriptan, eletriptan, frovatriptan, naratriptan, rizatriptan, sumatriptan, zolmitriptan -certain medicines for seizures like carbamazepine, oxcarbazepine, phenobarbital, primidone -certain medicines that treat or prevent blood clots like warfarin -digoxin -furazolidone -general anesthetics like halothane, isoflurane, methoxyflurane, propofol -linezolid -local anesthetics like lidocaine, pramoxine, tetracaine -medicines that relax muscles for surgery -other narcotic medicines for pain or cough -phenothiazines like chlorpromazine, mesoridazine, prochlorperazine, thioridazine -procarbazine This list may not describe all possible interactions. Give your health care provider a list of all the medicines, herbs, non-prescription drugs, or dietary supplements you use. Also tell them if you smoke, drink alcohol, or use illegal drugs. Some items may interact with your medicine. What should I watch for while using this medicine? Tell your doctor or health care professional if your pain does not go away, if it gets worse, or if you have new or a different type of pain. You may develop tolerance to the medicine. Tolerance means that you will need a higher dose of the medicine for pain relief. Tolerance is normal and is expected if you take this medicine for a long time. Do not suddenly stop taking your medicine because you may develop a severe reaction. Your body becomes used to the medicine. This does NOT mean  you are addicted. Addiction is a behavior related to getting and using a drug for a non-medical reason. If you have pain, you have a medical reason to take pain medicine. Your doctor will tell you how much medicine to take. If your doctor wants you to stop the medicine, the dose will be slowly lowered over time to avoid any side effects. There are different types of narcotic medicines (opiates). If you take more than one type at the same time or if you are taking another medicine that also causes drowsiness, you may have more side effects. Give your health care provider a list of all medicines you use. Your doctor will tell you how much medicine to take. Do not take more medicine than directed. Call emergency for help if you have problems breathing or unusual sleepiness. You may get drowsy or dizzy. Do not drive, use machinery, or do anything that needs mental alertness until you know how this medicine affects you. Do not stand or sit up quickly, especially if you are an older patient. This reduces the risk of dizzy or fainting spells. Alcohol can increase or decrease the effects of this medicine. Avoid alcoholic drinks. You may have constipation. Try to have a bowel movement at least every 2 to 3 days. If you do not have  a bowel movement for 3 days, call your doctor or health care professional. Your mouth may get dry. Chewing sugarless gum or sucking hard candy, and drinking plenty of water may help. Contact your doctor if the problem does not go away or is severe. What side effects may I notice from receiving this medicine? Side effects that you should report to your doctor or health care professional as soon as possible: -allergic reactions like skin rash, itching or hives, swelling of the face, lips, or tongue -breathing problems -confusion -seizures -signs and symptoms of low blood pressure like dizziness; feeling faint or lightheaded, falls; unusually weak or tired -trouble passing urine or change  in the amount of urine Side effects that usually do not require medical attention (report to your doctor or health care professional if they continue or are bothersome): -constipation -dry mouth -nausea, vomiting -tiredness This list may not describe all possible side effects. Call your doctor for medical advice about side effects. You may report side effects to FDA at 1-800-FDA-1088. Where should I keep my medicine? Keep out of the reach of children. This medicine may cause accidental overdose and death if it taken by other adults, children, or pets. Mix any unused medicine with a substance like cat litter or coffee grounds. Then throw the medicine away in a sealed container like a sealed bag or a coffee can with a lid. Do not use the medicine after the expiration date. Store at room temperature between 15 and 30 degrees C (59 and 86 degrees F). NOTE: This sheet is a summary. It may not cover all possible information. If you have questions about this medicine, talk to your doctor, pharmacist, or health care provider.  2018 [employer]vier/Gold Standard (2015-01-06 09:00:04)

## 2018-03-16 NOTE — Discharge Summary (Signed)
Physician Discharge Summary  Patient ID: Alexandra Haynes MRN: [id number] DOB/AGE: 1946-04-03 72 y.o.  Admit date: 03/15/2018 Discharge date: 03/16/2018  Admission Diagnoses: Endometrial cancer [facility])  Discharge Diagnoses:  Principal Problem:   Endometrial cancer [facility])   Discharged Condition:  The patient is in good condition and stable for discharge.    Hospital Course: On 03/15/2018, the patient underwent the following: Procedure(s): XI ROBOTIC ASSISTED TOTAL HYSTERECTOMY WITH BILATERAL SALPINGO OOPHORECTOMY WITH VAGINAL WALL BIOPSY SENTINEL NODE BIOPSY.  The postoperative course was uneventful.  She was discharged to home on postoperative day 1 tolerating a regular diet, passing flatus, voiding, pain controlled. She will come to the [facility] on Friday for a repeat Bmet to assess creatinine.   Consults: None  Significant Diagnostic Studies: None  Treatments: surgery: see above  Discharge Exam: Blood pressure (!) 131/54, pulse 84, temperature 97.8 F (36.6 C), resp. rate 16, height 5\' 5"  (1.651 m), weight 177 lb (80.3 kg), SpO2 100 %. General appearance: alert, cooperative, appears stated age and no distress Resp: clear to auscultation bilaterally Cardio: regular rate and rhythm, S1, S2 normal, no murmur, click, rub or gallop GI: soft, non-tender; bowel sounds normal; no masses,  no organomegaly Extremities: extremities normal, atraumatic, no cyanosis or edema Incision/Wound: Lap sites to the abdomen with dermabond without erythema or drainage  Disposition: Discharge disposition: 01-Home or Self Care       Discharge Instructions    Call MD for:  difficulty breathing, headache or visual disturbances   Complete by:  As directed    Call MD for:  extreme fatigue   Complete by:  As directed    Call MD for:  hives   Complete by:  As directed    Call MD for:  persistant dizziness or light-headedness   Complete by:  As directed    Call MD for:  persistant nausea and  vomiting   Complete by:  As directed    Call MD for:  redness, tenderness, or signs of infection (pain, swelling, redness, odor or green/yellow discharge around incision site)   Complete by:  As directed    Call MD for:  severe uncontrolled pain   Complete by:  As directed    Call MD for:  temperature >100.4   Complete by:  As directed    Diet - low sodium heart healthy   Complete by:  As directed    Discharge instructions   Complete by:  As directed    Plan to come to the [facility] on Friday to have lab work to make sure your kidney function is improving. Make sure you stay hydrated   Driving Restrictions   Complete by:  As directed    No driving for 1 week.  Do not take narcotics and drive.   Increase activity slowly   Complete by:  As directed    Lifting restrictions   Complete by:  As directed    No lifting greater than 10 lbs.   Sexual Activity Restrictions   Complete by:  As directed    No sexual activity, nothing in the vagina, for 8 weeks.     Allergies as of 03/16/2018      Reactions   Latex Other (See Comments)   Causes blisters   Sulfa Antibiotics Other (See Comments)   Made her pass out.      Medication List    STOP taking these medications   aspirin 81 MG EC tablet     TAKE these  medications   AQUA GLYCOLIC FACE Crea Apply 1 application topically daily as needed (dry skin).   DULoxetine 60 MG capsule Commonly known as:  CYMBALTA Take 60 mg by mouth 2 (two) times daily.   EYE DROPS ADVANCED RELIEF OP Place 1 drop into both eyes daily as needed (dry eyes).   GAS-X PO Take 1 tablet by mouth daily as needed (gas).   hydrochlorothiazide 12.5 MG tablet Commonly known as:  HYDRODIURIL Take 12.5 mg by mouth daily.   losartan 25 MG tablet Commonly known as:  COZAAR Take 25 mg by mouth daily.   multivitamin with minerals Tabs tablet Take 1 tablet by mouth daily.   naproxen sodium 220 MG tablet Commonly known as:  ALEVE Take 220-440 mg by mouth  daily as needed (pain).   senna-docusate 8.6-50 MG tablet Commonly known as:  Senokot-S Take 2 tablets by mouth at bedtime. Hold for loose stools   traMADol 50 MG tablet Commonly known as:  ULTRAM Take 1 tablet (50 mg total) by mouth every 6 (six) hours as needed for severe pain.      Follow-up Information    Everitt Amber, MD Follow up on 04/08/2018.   Specialty:  Gynecologic Oncology Why:  at 10:45am at the [facility] information: [place] [id number] [phone]           Greater than thirty minutes were spend for face to face discharge instructions and discharge orders/summary in EPIC.   Signed: Dorothyann Gibbs 03/16/2018, 12:04 PM

## 2018-03-16 NOTE — Progress Notes (Signed)
Bmet ordered for 03/18/18 to follow up on slight elevation during hospitalization.

## 2018-03-16 NOTE — Progress Notes (Signed)
Patient discharged to home w/ family. Given all belongings, instructions. Verbalized understanding of all instructions. Escorted to pov via w/c. 

## 2018-03-18 ENCOUNTER — Telehealth: Payer: Self-pay | Admitting: Gynecologic Oncology

## 2018-03-18 ENCOUNTER — Inpatient Hospital Stay: Payer: Medicare Other

## 2018-03-18 DIAGNOSIS — C541 Malignant neoplasm of endometrium: Secondary | ICD-10-CM | POA: Diagnosis not present

## 2018-03-18 DIAGNOSIS — R7989 Other specified abnormal findings of blood chemistry: Secondary | ICD-10-CM

## 2018-03-18 LAB — BASIC METABOLIC PANEL
Anion gap: 8 (ref 5–15)
BUN: 13 mg/dL (ref 8–23)
CO2: 27 mmol/L (ref 22–32)
Calcium: 9.1 mg/dL (ref 8.9–10.3)
Chloride: 102 mmol/L (ref 98–111)
Creatinine, Ser: 0.91 mg/dL (ref 0.44–1.00)
GFR calc Af Amer: >60 mL/min (ref >60)
GLUCOSE: 93 mg/dL (ref 70–99)
POTASSIUM: 3.9 mmol/L (ref 3.5–5.1)
Sodium: 137 mmol/L (ref 135–145)

## 2018-03-18 NOTE — Telephone Encounter (Signed)
Left message for patient about repeat Bmet done today. Advised to call the office for any needs or concerns.

## 2018-03-21 ENCOUNTER — Telehealth: Payer: Self-pay | Admitting: Gynecologic Oncology

## 2018-03-21 NOTE — Telephone Encounter (Signed)
Post op telephone call to check patient status.  Patient describes expected post operative status.  Adequate PO intake reported.  Bladder functioning without difficulty.  Taking sennakot nightly and increasing water intake. Pain minimal.  Reportable signs and symptoms reviewed.  Final path discussed.  No needs voiced. Follow up appt given.

## 2018-03-23 ENCOUNTER — Telehealth: Payer: Self-pay | Admitting: Gynecologic Oncology

## 2018-03-23 NOTE — Telephone Encounter (Signed)
Called patient to check on status.  She states she is doing well except for some intermittent stomach cramps this am but they have improved after using the bathroom.  Discussed final path again and advised her that we are going to discuss at our tumor board conference and have the pathologist review her pre-op biopsy with the final path specimen to compare given her original biopsy was a higher grade.  No concerns or needs voiced.  Advised to call for any needs.

## 2018-03-28 ENCOUNTER — Telehealth: Payer: Self-pay

## 2018-03-28 NOTE — Telephone Encounter (Signed)
Pt has not had a good BM since last week.  Increased Senokot to 2 q hs since               03-23-18. Pt has issues with constipation normally. She said that she just finally had a formed soft stool but had to push. Suggested that she get some Miralax and take 1 Capful in the am. And see if this helps her have a good evacuation.  Told her to take the senokot this evening.  Pt appreciated the information. Pt also to increase fluids to at least 64 oz daily to help with bowels.

## 2018-04-08 ENCOUNTER — Encounter: Payer: Self-pay | Admitting: Oncology

## 2018-04-08 ENCOUNTER — Encounter: Payer: Self-pay | Admitting: Gynecologic Oncology

## 2018-04-08 ENCOUNTER — Inpatient Hospital Stay: Payer: Medicare Other | Attending: Gynecology | Admitting: Gynecologic Oncology

## 2018-04-08 VITALS — BP 168/68 | HR 78 | Temp 98.0°F | Resp 18 | Ht 65.0 in | Wt 173.0 lb

## 2018-04-08 DIAGNOSIS — Z7189 Other specified counseling: Secondary | ICD-10-CM

## 2018-04-08 DIAGNOSIS — C541 Malignant neoplasm of endometrium: Secondary | ICD-10-CM | POA: Insufficient documentation

## 2018-04-08 DIAGNOSIS — Z90710 Acquired absence of both cervix and uterus: Secondary | ICD-10-CM | POA: Diagnosis not present

## 2018-04-08 DIAGNOSIS — Z90722 Acquired absence of ovaries, bilateral: Secondary | ICD-10-CM | POA: Diagnosis not present

## 2018-04-08 NOTE — Patient Instructions (Signed)
Please notify Dr Denman George at phone number [phone] if you notice vaginal bleeding, new pelvic or abdominal pains, bloating, feeling full easy, or a change in bladder or bowel function.   Please return to see Dr Denman George in 6 months.  Dr Denman George will schedule you to see the genetics specialists because you have a history of 2 different cancers.  Dr Denman George will discuss your case at tumor conference and determine if it is necessary or valuable to administer radiation to the top of the vagina to reduce the risk of the cancer returning.
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
## 2018-04-26 ENCOUNTER — Encounter: Payer: Self-pay | Admitting: Gynecologic Oncology

## 2018-04-26 ENCOUNTER — Encounter: Payer: Self-pay | Admitting: Oncology

## 2018-04-26 ENCOUNTER — Telehealth: Payer: Self-pay | Admitting: Oncology

## 2018-04-26 DIAGNOSIS — C541 Malignant neoplasm of endometrium: Secondary | ICD-10-CM

## 2018-04-26 NOTE — Progress Notes (Signed)
Gynecologic Oncology Multi-Disciplinary Disposition Conference Note  Date of the Conference: April 11, 2018  Patient Name: Alexandra Haynes  Referring Provider: Dr. Barrie Dunker Primary GYN Oncologist: Dr. Everitt Amber  Stage/Disposition:  Stage IA grade 3 endometrial cancer. Disposition is to consultation with radiation oncology for vaginal brachytherapy.   This Multidisciplinary conference took place involving physicians from [place], [place], Radiation Oncology, Pathology, Radiology along with the Gynecologic Oncology Nurse Practitioner and RN.  Comprehensive assessment of the patient's malignancy, staging, need for surgery, chemotherapy, radiation therapy, and need for further testing were reviewed. Supportive measures, both inpatient and following discharge were also discussed. The recommended plan of care is documented. Greater than 35 minutes were spent correlating and coordinating this patient's care.

## 2018-04-26 NOTE — Telephone Encounter (Signed)
Left a message for patient regarding radiation appointment. Requested a return call.

## 2018-04-28 NOTE — Telephone Encounter (Signed)
Notified Soniyah of appointment with Dr. Sondra Come on 05/18/2018.  She verbalized understanding and agreement.

## 2018-05-03 ENCOUNTER — Inpatient Hospital Stay: Payer: Medicare Other

## 2018-05-03 ENCOUNTER — Inpatient Hospital Stay: Payer: Medicare Other | Attending: Gynecology | Admitting: Genetics

## 2018-05-03 DIAGNOSIS — C541 Malignant neoplasm of endometrium: Secondary | ICD-10-CM

## 2018-05-03 DIAGNOSIS — Z803 Family history of malignant neoplasm of breast: Secondary | ICD-10-CM

## 2018-05-03 DIAGNOSIS — Z853 Personal history of malignant neoplasm of breast: Secondary | ICD-10-CM | POA: Diagnosis not present

## 2018-05-04 ENCOUNTER — Encounter: Payer: Self-pay | Admitting: Genetics

## 2018-05-04 DIAGNOSIS — Z853 Personal history of malignant neoplasm of breast: Secondary | ICD-10-CM

## 2018-05-04 DIAGNOSIS — Z803 Family history of malignant neoplasm of breast: Secondary | ICD-10-CM | POA: Insufficient documentation

## 2018-05-04 HISTORY — DX: Personal history of malignant neoplasm of breast: Z85.3
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
## 2018-05-11 ENCOUNTER — Telehealth: Payer: Self-pay | Admitting: Genetics

## 2018-05-11 NOTE — Telephone Encounter (Signed)
Revealed negative genetic testing.   This normal result is reassuring and indicates that it is unlikely Alexandra Haynes's cancer is due to a hereditary cause.  It is unlikely that there is an increased risk of another cancer due to a mutation in one of these genes.  However, genetic testing is not perfect, and cannot definitively rule out a hereditary cause.  It will be important for her to keep in contact with genetics to learn if any additional testing may be needed in the future.     She should still continue to follow her healthcare providers' recommendations regarding cancer treatment, management, and screening.

## 2018-05-12 ENCOUNTER — Encounter: Payer: Self-pay | Admitting: Genetics

## 2018-05-12 ENCOUNTER — Ambulatory Visit: Payer: Self-pay | Admitting: Genetics

## 2018-05-12 DIAGNOSIS — Z803 Family history of malignant neoplasm of breast: Secondary | ICD-10-CM

## 2018-05-12 DIAGNOSIS — Z1379 Encounter for other screening for genetic and chromosomal anomalies: Secondary | ICD-10-CM

## 2018-05-12 DIAGNOSIS — C541 Malignant neoplasm of endometrium: Secondary | ICD-10-CM

## 2018-05-12 DIAGNOSIS — Z853 Personal history of malignant neoplasm of breast: Secondary | ICD-10-CM

## 2018-05-12 NOTE — Progress Notes (Signed)
HPI:  [name] was previously seen in the [facility] clinic on 05/03/2018 due to a personal and family history of cancer and concerns regarding a hereditary predisposition to cancer. Please refer to our prior cancer genetics clinic note for more information regarding [name]'s medical, social and family histories, and our assessment and recommendations, at the time. [name] recent genetic test results were disclosed to her, as well as recommendations warranted by these results. These results and recommendations are discussed in more detail below.  CANCER HISTORY:  In 2011, at the age of 73, [name] was diagnosed with breast cancer of the left breast.  She reporst she had antiestrogen therapy for a brief period of time- less than a year, but could not tolerate the side affects. Recently in 2019, she was dx with endometriod carcinoma of the endometrium. IHC was normal, MSI was stable.    FAMILY HISTORY:  We obtained a detailed, 4-generation family history.  Significant diagnoses are listed below: Family History  Problem Relation Age of Onset  . Breast cancer Paternal Aunt        dx >50  . Liver cancer Paternal Grandmother   . Alzheimer's disease Mother   . Cancer Mother        either female or colon cancer  . Heart attack Father   . Emphysema Father   . Cancer Paternal Uncle        type unk  . Cancer Maternal Grandmother        maybe had an abdominal cancer- colon or kidney? dx>50    [name] has a son and a daughter (ages 41 and 92) with no hx of cancer.  No biological grandchildren (adopted grandchildren). [name] has 2 brothers and 2 sisters: -2 bothers who died over 36, no hx of cancer. 1 died due to bleeding/blood loss but they could not figure out why/where he was loosing blood.  -1 sister alive at 76 no hx of cancer.  -1 sister alive at 84 no hx of cancer.   [name] father: died in his 73's/70's due to emphysema and heart attack Paternal  Aunts/Uncles: 8-9 paternal aunts/uncles. 1 paternal aunt as dx with breast cancer >50, 1 paternal uncle had cancer, type of cancer unk.  Paternal cousins: no known hx of cancer.  Paternal grandfather: unk Paternal grandmother:liver cancer  [name]'s mother: deceased, died due to either a female caner or colon cancer.  She also had Alzheimer's disease. Maternal Aunts/Uncles: 8 maternal aunts/uncles. 1 died of a face/jaw cancer.  No others with hx of cancer.  Maternal cousins: no known hx of cancer.  Maternal grandfather: no hx of cancer.  Maternal grandmother:died older than 60- she maybe had an abdominal cancer- possibly colon or kidney?  [name] is unaware of previous family history of genetic testing for hereditary cancer risks. Patient's maternal ancestors are of N. European descent, and paternal ancestors are of N. European descent. There is no reported Ashkenazi Jewish ancestry. There is no known consanguinity.  No personal or family hx of autism  GENETIC TEST RESULTS: Genetic testing performed through [facility]'s Common Hereditary Cancers Panel reported out on 05/09/2018 showed no pathogenic mutations.  The Common Hereditary Cancer Panel offered by [facility] includes sequencing and/or deletion duplication testing of the following 53 genes: APC, ATM, AXIN2, BARD1, BMPR1A, BRCA1, BRCA2, BRIP1, BUB1B, CDH1, CDK4, CDKN2A, CHEK2, CTNNA1, DICER1, ENG, EPCAM, GALNT12, GREM1, HOXB13, KIT, MEN1, MLH1, MLH3, MSH2, MSH3, MSH6, MUTYH, NBN, NF1, NTHL1, PALB2,  PDGFRA, PMS2, POLD1, POLE, PTEN, RAD50, RAD51C, RAD51D, RNF43, RPS20, SDHA, SDHB, SDHC, SDHD, SMAD4, SMARCA4, STK11, TP53, TSC1, TSC2, VHL  The test report will be scanned into EPIC and will be located under the Molecular Pathology section of the Results Review tab. A portion of the result report is included below for reference.     We discussed with [name] that because current genetic testing is not perfect, it is possible there may be  a gene mutation in one of these genes that current testing cannot detect, but that chance is small.  We also discussed, that there could be another gene that has not yet been discovered, or that we have not yet tested, that is responsible for the cancer diagnoses in the family. It is also possible there is a hereditary cause for the cancer in the family that [name] did not inherit and therefore was not identified in her testing.  Therefore, it is important to remain in touch with cancer genetics in the future so that we can continue to offer [name] the most up to date genetic testing.   CANCER SCREENING RECOMMENDATIONS: [name]'s test result is negative (normal).  This means that we have not identified a hereditary cause for her personal and family history of cancer at this time.   While reassuring, this does not definitively rule out a hereditary predisposition to cancer. It is still possible that there could be genetic mutations that are undetectable by current technology, or genetic mutations in genes that have not been tested or identified to increase cancer risk.  Therefore, it is recommended she continue to follow the cancer management and screening guidelines provided by her oncology and primary healthcare provider. An individual's cancer risk is not determined by genetic test results alone.  Overall cancer risk assessment includes additional factors such as personal medical history, family history, etc.  These should be used to make a personalized plan for cancer prevention and surveillance.    RECOMMENDATIONS FOR FAMILY MEMBERS:  Relatives in this family might be at some increased risk of developing cancer, over the general population risk, simply due to the family history of cancer.  We recommended women in this family have a yearly mammogram beginning at age 40, or 10 years younger than the earliest onset of cancer, an annual clinical breast exam, and perform monthly breast self-exams.  Women in this family should also have a gynecological exam as recommended by their primary provider. All family members should have a colonoscopy by age 50 (or as directed by their doctors).  All family members should inform their physicians about the family history of cancer so their doctors can make the most appropriate screening recommendations for them.   It is also possible there is a hereditary cause for the cancer in [name]'s family that she did not inherit and therefore was not identified in her.     FOLLOW-UP: Lastly, we discussed with [name] that cancer genetics is a rapidly advancing field and it is possible that new genetic tests will be appropriate for her and/or her family members in the future. We encouraged her to remain in contact with cancer genetics on an annual basis so we can update her personal and family histories and let her know of advances in cancer genetics that may benefit this family.   Our contact number was provided. [name]'s questions were answered to her satisfaction, and she knows she is welcome to call us at anytime with additional questions   or concerns.   Lindsay Smith, MS, CGC Certified Genetic Counselor [email or web address] 

## 2018-05-13 NOTE — Progress Notes (Signed)
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 High vs low risk pending pathologic review of the original biopsy.  Alexandra Haynes presented with symptoms of: post menopausal bleeding and some cramping.  Endometrial biopsy was performed revealing a high-grade endometrial carcinoma.  The patient has had a CAT scan on March 07, 2018 showing no evidence of metastatic disease and a normal size uterus.  Currently the patient is have a little bit of spotting and some cramping but otherwise no other pelvic symptoms.  She has a past history of breast cancer treated with surgery and radiation therapy approximately 6 years ago.  Biopsies revealed: 03/15/18: Diagnosis 1. Vagina, biopsy, right wall - SQUAMOUS-LINED MUCOSA, NEGATIVE FOR CARCINOMA. 2. Lymph node, sentinel, biopsy, right medial external iliac - ONE LYMPH NODE, NEGATIVE FOR CARCINOMA (0/1). 3. Lymph node, sentinel, biopsy, right lateral external iliac - ONE LYMPH NODE, NEGATIVE FOR CARCINOMA (0/1). 4. Lymph node, sentinel, biopsy, right obturator - ONE LYMPH NODE, NEGATIVE FOR CARCINOMA (0/1). 5. Lymph node, sentinel, biopsy, left obturator - ONE LYMPH NODE, NEGATIVE FOR CARCINOMA (0/1). 6. Lymph node, sentinel, biopsy, left common iliac - ONE LYMPH NODE, NEGATIVE FOR CARCINOMA (0/1). 7. Uterus +/- tubes/ovaries, neoplastic, bilateral fallopian tubes and ovaries - ENDOMETRIOID CARCINOMA OF ENDOMETRIUM, FIGO GRADE 1, WITH INVASION LESS THAN HALF OF THE MYOMETRIUM. - NO INVOLVEMENT OF UTERINE SEROSA, CERVICAL STROMA OR ADNEXA. - SEE ONCOLOGY TABLE. Microscopic Comment 7. UTERUS, CARCINOMA OR CARCINOSARCOMA Procedure: Total hysterectomy and bilateral salpingo-oophorectomy Histologic type: Endometrioid carcinoma Histologic Grade: FIGO grade 1 Myometrial Invasion: Present Depth of Myometrial Invasion: 5 mm Myometrial  Thickness: 13 mm Uterine Serosa Involvement: Not involved Cervical Stromal Involvement: Not involved Other Tissue/Organ Involvement: Not involved Lymphovascular Invasion: Not identified Regional Lymph Nodes: Examined: 5 Sentinel 0 Non-sentinel 5 Total Lymph nodes with metastasis: 0 Isolated tumor cells (< 0.2 mm): 0 Micrometastasis: (> 0.2 mm and < 2.0 mm): 0 Macrometastasis: (> 2.0 mm): 0 Extracapsular extension: Not applicable Tumor Block for Ancillary Studies: 7A MMR/MSI testing: Will be performed and reported separately Pathologic Stage Classification (pTNM, AJCC 8th edition): pT1a, pN0 FIGO Stage: IA Representative Tumor Block: 7A Comment: [id number] was performed on each sentinel lymph node and is negative  Past/Anticipated interventions by Gyn/Onc surgery, if any: 03/15/18:  Operation: Robotic-assisted laparoscopic total hysterectomy with bilateral salpingoophorectomy, SLN biopsy, vaginal biopsy  Surgeon: Donaciano Eva  Past/Anticipated interventions by medical oncology, if any: None at this time.  Weight changes, if any: No  Bowel/Bladder complaints, if any: Pt denies dysuria. Pt reports occasional diarrhea.  Nausea/Vomiting, if any: No.   Pain issues, if any:  Pt denies c/o pain.   SAFETY ISSUES:  Prior radiation? LEFT breast 2011  Pacemaker/ICD? No  Possible current pregnancy? No, pt is post surgical  Is the patient on methotrexate? No  Current Complaints / other details:  Pt presents today for initial consult with Dr. Sondra Come for Radiation Oncology. Pt is accompanied by husband.   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 HER-2 negative. She underwent post. radiation therapy, and was placed on letrozole. She has developed arthralgias and myalgias after one year. She has been diagnosed with polymyalgia rheumatica. She was on endocrine therapy for one year only.  Patient with stage I ER/PR positive HER-2 negative carcinoma diagnosed in 2011 status post mastectomy with 1-year-of adjuvant endocrine therapy discontinued secondary to intolerance.  Alexandra Sousa, RN BSN

## 2018-05-18 ENCOUNTER — Ambulatory Visit
Admission: RE | Admit: 2018-05-18 | Discharge: 2018-05-18 | Disposition: A | Discharge status: Home / Self Care | Payer: Medicare Other | Source: Ambulatory Visit | Attending: Radiation Oncology | Admitting: Radiation Oncology

## 2018-05-18 ENCOUNTER — Encounter: Payer: Self-pay | Admitting: Radiation Oncology

## 2018-05-18 ENCOUNTER — Other Ambulatory Visit: Payer: Self-pay

## 2018-05-18 VITALS — BP 147/80 | HR 79 | Temp 98.1°F | Resp 18 | Ht 65.0 in | Wt 173.2 lb

## 2018-05-18 DIAGNOSIS — I10 Essential (primary) hypertension: Secondary | ICD-10-CM | POA: Insufficient documentation

## 2018-05-18 DIAGNOSIS — C541 Malignant neoplasm of endometrium: Secondary | ICD-10-CM

## 2018-05-18 DIAGNOSIS — K219 Gastro-esophageal reflux disease without esophagitis: Secondary | ICD-10-CM | POA: Insufficient documentation

## 2018-05-18 DIAGNOSIS — Z923 Personal history of irradiation: Secondary | ICD-10-CM | POA: Diagnosis not present

## 2018-05-18 DIAGNOSIS — Z803 Family history of malignant neoplasm of breast: Secondary | ICD-10-CM | POA: Insufficient documentation

## 2018-05-18 DIAGNOSIS — Z809 Family history of malignant neoplasm, unspecified: Secondary | ICD-10-CM | POA: Diagnosis not present

## 2018-05-18 DIAGNOSIS — Z79899 Other long term (current) drug therapy: Secondary | ICD-10-CM | POA: Diagnosis not present

## 2018-05-18 DIAGNOSIS — G4700 Insomnia, unspecified: Secondary | ICD-10-CM | POA: Insufficient documentation

## 2018-05-18 DIAGNOSIS — F329 Major depressive disorder, single episode, unspecified: Secondary | ICD-10-CM | POA: Insufficient documentation

## 2018-05-18 DIAGNOSIS — Z853 Personal history of malignant neoplasm of breast: Secondary | ICD-10-CM | POA: Insufficient documentation

## 2018-05-18 DIAGNOSIS — Z9012 Acquired absence of left breast and nipple: Secondary | ICD-10-CM | POA: Diagnosis not present

## 2018-05-18 DIAGNOSIS — Z90722 Acquired absence of ovaries, bilateral: Secondary | ICD-10-CM | POA: Diagnosis not present

## 2018-05-18 DIAGNOSIS — Z90710 Acquired absence of both cervix and uterus: Secondary | ICD-10-CM | POA: Diagnosis not present

## 2018-05-18 NOTE — Progress Notes (Signed)
Radiation Oncology         [phone]) [phone] ________________________________  Initial Outpatient Consultation  Name: VIVIANNA PICCINI MRN: [id number]  Date: 05/18/2018  DOB: 14-Jan-1946  KW:IOXBD, Shireen Quan, FNP  Everitt Amber, MD   REFERRING PHYSICIAN: Everitt Amber, MD  DIAGNOSIS: The encounter diagnosis was Endometrial cancer [facility]).   High grade endometrioid adenocarcinoma stage IA, pT1a, pN0  HISTORY OF PRESENT ILLNESS::Massa A Geisler is a 73 y.o. female who is presenting to the office today for evaluation of endometrial adenocarcinoma. She is accompanied by her husband. She presented in October to her gynecologist with symptoms of post menopausal bleeding and some cramping. Endometrial biopsy was then performed revealing a high-grade endometrial carcinoma. She has a history of breast cancer treated with left mastectomy and radiation in 2011 in [place]. She did not receive chemotherapy for her breast cancer but was placed on hormonal therapy which made her extremely depressed. She stopped hormone therapy after a year because of this. She is here today to discuss radiation options for her endometrial cancer; since her cancer was in fact high grade, she meets high intermediate risk factors for recurrence.  An endometrial biopsy was performed in [facility] by Dr. Barrie Dunker on February 24, 2018. The results included high grade endometrial adenocarcinoma with serous and endometrioid features. She was then referred to [facility].  A CT scan on March 05, 2018 showed no evidence of metastatic disease.   She underwent robotic assisted total hysterectomy with bilateral salpingo oophorectomy as well as vaginal and sentinel node biopsies with Dr. Denman George on March 15, 2018. Pathology results are as follows: 1. Vagina, biopsy, right wall - showed squamous-lined mucosa, negative for carcinoma. 2. All 5 of her sentinel node biopsies were negative for carcinoma. 3. Uterus +/- tubes/ovaries, neoplastic, bilateral fallopian  tubes and ovaries - showed endometrioid carcinoma of endometrium, FIGO grade 1 with invasion less than half of the myometrium. No involvement of the uterine serosa, cervical stroma or adnexa was noted.    she reports associated pain following her surgery in November, which has since been resolved. She was instructed to take Senokot for constipation, which did not provide relief. Miralax, she reported, worked too well, causing diarrhea. she denies headaches, double vision, and any other symptoms at this time.    PREVIOUS RADIATION THERAPY: Yes 10/07/09-11/21/09, left chest wall, 35 treatments over 7 weeks  PAST MEDICAL HISTORY:  has a past medical history of Anxiety, Breast cancer ([place]), Chronic fatigue, Complication of anesthesia, DDD (degenerative disc disease), lumbar, Depression, Family history of breast cancer, GERD (gastroesophageal reflux disease), Hypertension, Insomnia related to another mental disorder, Meniere disease, Mood disorder ([place]), Personal history of breast cancer (05/04/2018), Pneumonia, Polymyalgia ([place]), and PONV (postoperative nausea and vomiting).    PAST SURGICAL HISTORY: Past Surgical History:  Procedure Laterality Date  . ABDOMINAL HYSTERECTOMY     03-15-18  . APPENDECTOMY     Appendix burst; extensive scar tissue  . CATARACT EXTRACTION W/ INTRAOCULAR LENS IMPLANT Bilateral   . CHOLECYSTECTOMY    . COLONOSCOPY  2005  . ESOPHAGOGASTRODUODENOSCOPY  2005  . MASTECTOMY  07/2009   Left  . ROBOTIC ASSISTED TOTAL HYSTERECTOMY WITH BILATERAL SALPINGO OOPHERECTOMY N/A 03/15/2018   Procedure: XI ROBOTIC ASSISTED TOTAL HYSTERECTOMY WITH BILATERAL SALPINGO OOPHORECTOMY WITH VAGINAL WALL BIOPSY;  Surgeon: Everitt Amber, MD;  Location: [facility];  Service: Gynecology;  Laterality: N/A;  . SENTINEL NODE BIOPSY N/A 03/15/2018   Procedure: SENTINEL NODE BIOPSY;  Surgeon: Everitt Amber, MD;  Location: [facility];  Service: Gynecology;  Laterality: N/A;    FAMILY HISTORY: family history includes  Alzheimer's disease in her mother; Breast cancer in her paternal aunt; Cancer in her maternal grandmother, mother, and paternal uncle; Emphysema in her father; Heart attack in her father; Liver cancer in her paternal grandmother.  SOCIAL HISTORY:  reports that she has never smoked. She has never used smokeless tobacco. She reports that she does not drink alcohol or use drugs.  ALLERGIES: Latex and Sulfa antibiotics  MEDICATIONS:  Current Outpatient Medications  Medication Sig Dispense Refill  . DULoxetine (CYMBALTA) 60 MG capsule Take 60 mg by mouth 2 (two) times daily.     . Emollient (AQUA GLYCOLIC FACE) CREA Apply 1 application topically daily as needed (dry skin).    . hydrochlorothiazide (HYDRODIURIL) 12.5 MG tablet Take 12.5 mg by mouth daily.  2  . losartan (COZAAR) 25 MG tablet Take 25 mg by mouth daily.     . Multiple Vitamin (MULTIVITAMIN WITH MINERALS) TABS tablet Take 1 tablet by mouth daily.    . naproxen sodium (ALEVE) 220 MG tablet Take 220-440 mg by mouth daily as needed (pain).    Marland Kitchen senna-docusate (SENOKOT-S) 8.6-50 MG tablet Take 2 tablets by mouth at bedtime. Hold for loose stools 60 tablet 1  . Simethicone (GAS-X PO) Take 1 tablet by mouth daily as needed (gas).    Sallye Lat (EYE DROPS ADVANCED RELIEF OP) Place 1 drop into both eyes daily as needed (dry eyes).    . traMADol (ULTRAM) 50 MG tablet Take 1 tablet (50 mg total) by mouth every 6 (six) hours as needed for severe pain. 10 tablet 0   No current facility-administered medications for this encounter.     REVIEW OF SYSTEMS:  A 10+ POINT REVIEW OF SYSTEMS WAS OBTAINED including neurology, dermatology, psychiatry, cardiac, respiratory, lymph, extremities, GI, GU, musculoskeletal, constitutional, reproductive, HEENT. All pertinent positives are noted in the HPI. All others are negative.    PHYSICAL EXAM:  height is 5\' 5"  (1.651 m) and weight is 173 lb 4 oz (78.6 kg). Her oral temperature is 98.1 F  (36.7 C). Her blood pressure is 147/80 (abnormal) and her pulse is 79. Her respiration is 18 and oxygen saturation is 100%.   General: Alert and oriented, in no acute distress HEENT: Head is normocephalic. Extraocular movements are intact. Oropharynx is clear. Neck: Neck is supple, no palpable cervical or supraclavicular lymphadenopathy. Heart: Regular in rate and rhythm with no murmurs, rubs, or gallops. Chest: Clear to auscultation bilaterally, with no rhonchi, wheezes, or rales. Abdomen: Soft, nontender, nondistended, with no rigidity or guarding. Extremities: No cyanosis or edema. Lymphatics: see Neck Exam Skin: No concerning lesions. Musculoskeletal: symmetric strength and muscle tone throughout. Neurologic: Cranial nerves II through XII are grossly intact. No obvious focalities. Speech is fluent. Coordination is intact. Psychiatric: Judgment and insight are intact. Affect is appropriate. Pelvic exam deferred until simulation and planning day.  Right breast with no palpable mass, nipple discharge or bleeding. Left chest area shows mastectomy scar without palpable or visible signs of recurrence. Patient has tattoos in place from her prior radiation therapy. Pt's abdominal scars have healed well.     ECOG = 1  0 - Asymptomatic (Fully active, able to carry on all predisease activities without restriction)  1 - Symptomatic but completely ambulatory (Restricted in physically strenuous activity but ambulatory and able to carry out work of a light or sedentary nature. For example, light housework, office work)  2 - Symptomatic, <50%  in bed during the day (Ambulatory and capable of all self care but unable to carry out any work activities. Up and about more than 50% of waking hours)  3 - Symptomatic, >50% in bed, but not bedbound (Capable of only limited self-care, confined to bed or chair 50% or more of waking hours)  4 - Bedbound (Completely disabled. Cannot carry on any self-care.  Totally confined to bed or chair)  5 - Death   Eustace Pen MM, Creech RH, Tormey DC, et al. [phone]). "Toxicity and response criteria of the [facility]". [place] Oncol. 5 (6): 649-55  LABORATORY DATA:  Lab Results  Component Value Date   WBC 8.8 03/16/2018   HGB 9.6 (L) 03/16/2018   HCT 30.3 (L) 03/16/2018   MCV 89.9 03/16/2018   PLT 230 03/16/2018   NEUTROABS 4.4 06/02/2017   Lab Results  Component Value Date   NA 137 03/18/2018   K 3.9 03/18/2018   CL 102 03/18/2018   CO2 27 03/18/2018   GLUCOSE 93 03/18/2018   CREATININE 0.91 03/18/2018   CALCIUM 9.1 03/18/2018      RADIOGRAPHY: No results found.    IMPRESSION: Stage IA, grade 3 endometrioid endometrial adenocarcinoma, pT1a, pN0. The patient has high intermediate risk factors for recurrence and therefore would recommend adjuvant brachytherapy to reduce the chances of vaginal cuff recurrence. The patient's original biopsy specimen was reviewed by our pathology department which confirmed the presence of a high grade lesion on the initial biopsy.  Today, I talked to the patient and her husband about the findings and work-up thus far.  We discussed the natural history of endometrial adenocarcinoma and general treatment, highlighting the role of brachytherapy in the management.  We discussed the available radiation techniques, and focused on the details of logistics and delivery.  We reviewed the anticipated acute and late sequelae associated with radiation in this setting.  The patient was encouraged to ask questions that I answered to the best of my ability.  A patient consent form was discussed and signed.  We retained a copy for our records.  The patient would like to proceed with radiation and will be scheduled for CT simulation.   PLAN: Pt is approximately 2 months out form her surgery and therefore can proceed with planning and treatment in the next few days. Pt will be scheduled for adjuvant vaginal  brachytherapy next week.      ------------------------------------------------  Blair Promise, PhD, MD      This document serves as a record of services personally performed by Gery Pray, MD. It was created on his behalf by Mary-Margaret Loma Messing, a trained medical scribe. The creation of this record is based on the scribe's personal observations and the provider's statements to them. This document has been checked and approved by the attending provider.

## 2018-05-26 ENCOUNTER — Telehealth: Payer: Self-pay | Admitting: *Deleted

## 2018-05-26 NOTE — Telephone Encounter (Signed)
CALLED PATIENT TO INFORM OF NEW HDR [facility], SPOKE WITH PATIENT AND SHE IS AWARE OF THESE APPTS. °

## 2018-05-30 ENCOUNTER — Telehealth: Payer: Self-pay | Admitting: *Deleted

## 2018-05-30 NOTE — Telephone Encounter (Signed)
CALLED PATIENT TO REMIND OF NEW HDR [place], NO ANSWER UNABLE TO LEAVE MESSAGE

## 2018-05-31 ENCOUNTER — Telehealth: Payer: Self-pay | Admitting: Oncology

## 2018-05-31 ENCOUNTER — Other Ambulatory Visit: Payer: Self-pay | Admitting: Gynecologic Oncology

## 2018-05-31 ENCOUNTER — Other Ambulatory Visit: Payer: Self-pay

## 2018-05-31 ENCOUNTER — Ambulatory Visit: Payer: Medicare Other | Admitting: Radiation Oncology

## 2018-05-31 ENCOUNTER — Ambulatory Visit: Admission: RE | Admit: 2018-05-31 | Payer: Medicare Other | Source: Ambulatory Visit | Admitting: Radiation Oncology

## 2018-05-31 ENCOUNTER — Encounter: Payer: Self-pay | Admitting: Radiation Oncology

## 2018-05-31 ENCOUNTER — Ambulatory Visit
Admission: RE | Admit: 2018-05-31 | Discharge: 2018-05-31 | Disposition: A | Discharge status: Home / Self Care | Payer: Medicare Other | Source: Ambulatory Visit | Attending: Radiation Oncology | Admitting: Radiation Oncology

## 2018-05-31 ENCOUNTER — Encounter: Payer: Self-pay | Admitting: Oncology

## 2018-05-31 VITALS — BP 192/87 | HR 75 | Temp 98.0°F | Resp 18 | Ht 64.0 in | Wt 173.4 lb

## 2018-05-31 DIAGNOSIS — C541 Malignant neoplasm of endometrium: Secondary | ICD-10-CM

## 2018-05-31 DIAGNOSIS — Z79899 Other long term (current) drug therapy: Secondary | ICD-10-CM | POA: Insufficient documentation

## 2018-05-31 MED ORDER — ESTROGENS, CONJUGATED 0.625 MG/GM VA CREA: 1.0000 | TOPICAL_CREAM | Freq: Every day | VAGINAL | 12 refills | Status: DC

## 2018-05-31 NOTE — Progress Notes (Addendum)
Pt presents today for new [place] HDR with Dr. Sondra Come. Pt is accompanied by sister. Pt reports "twinges" of pain in pelvis. Pt denies dysuria/hematuria. Pt reports scant amount of vaginal spotting. Pt denies diarrhea/constipation, rectal bleeding. Pt denies abdominal bloating, N/V.  BP (!) 192/87 (BP Location: Right Arm, Patient Position: Sitting)   Pulse 75   Temp 98 F (36.7 C) (Oral)   Resp 18   Ht 5\' 4"  (1.626 m)   Wt 173 lb 6.4 oz (78.7 kg)   SpO2 99%   BMI 29.76 kg/m   Wt Readings from Last 3 Encounters:  05/31/18 173 lb 6.4 oz (78.7 kg)  05/18/18 173 lb 4 oz (78.6 kg)  04/08/18 173 lb (78.5 kg)   [name], RN BSN    Patient seen by me at the request of Dr. Sondra Come for vaginal discharge/bleeding.  On speculum examination the oral half centimeter tongue of tissue at the right vaginal sidewall was again seen (this was biopsied intraoperatively noted to be benign).  It appeared unchanged.  At the apex of the vagina on the left vaginal apex was some slight mucosal separation but underlying tissues were intact.  This mucosal separation does not represent cuff dehiscence.  I am recommending vaginal Premarin nightly for 1 month.  I will reevaluate the patient in 3 weeks time for healing and if she is doing well we will proceed with radiation at that time.  Thereasa Solo, MD

## 2018-05-31 NOTE — Telephone Encounter (Signed)
Left a message for [facility] regarding joint appointment with Dr. Denman George and Dr. Sondra Come.  Requested a return call.

## 2018-05-31 NOTE — Progress Notes (Unsigned)
Vaginal estrogen for mucosal separation after hysterectomy.  X  1 month.  Thereasa Solo, MD

## 2018-05-31 NOTE — Progress Notes (Signed)
Radiation Oncology         (336) [phone] ________________________________  Name: LINNELL SWORDS MRN: [id number]  Date: 05/31/2018  DOB: [birth year]  Vaginal Brachytherapy Procedure Note  CC: [name], FNP Everitt Amber, MD    ICD-10-CM   1. Endometrial cancer (HCC) C54.1     Diagnosis: High grade endometrioid adenocarcinoma stage IA, pT1a, pN0  Narrative: She returns today for vaginal cylinder fitting. Over the past couple days the patient has noticed some very mild vaginal bleeding and slight twinges of pain in the pelvis area.She otherwise is doing well  ALLERGIES: is allergic to latex and sulfa antibiotics.  Meds: Current Outpatient Medications  Medication Sig Dispense Refill  . DULoxetine (CYMBALTA) 60 MG capsule Take 60 mg by mouth 2 (two) times daily.     . Emollient (AQUA GLYCOLIC FACE) CREA Apply 1 application topically daily as needed (dry skin).    . hydrochlorothiazide (HYDRODIURIL) 12.5 MG tablet Take 12.5 mg by mouth daily.  2  . losartan (COZAAR) 25 MG tablet Take 25 mg by mouth daily.     . Multiple Vitamin (MULTIVITAMIN WITH MINERALS) TABS tablet Take 1 tablet by mouth daily.    . naproxen sodium (ALEVE) 220 MG tablet Take 220-440 mg by mouth daily as needed (pain).    Marland Kitchen senna-docusate (SENOKOT-S) 8.6-50 MG tablet Take 2 tablets by mouth at bedtime. Hold for loose stools 60 tablet 1  . Simethicone (GAS-X PO) Take 1 tablet by mouth daily as needed (gas).    Sallye Lat (EYE DROPS ADVANCED RELIEF OP) Place 1 drop into both eyes daily as needed (dry eyes).    . traMADol (ULTRAM) 50 MG tablet Take 1 tablet (50 mg total) by mouth every 6 (six) hours as needed for severe pain. 10 tablet 0  . conjugated estrogens (PREMARIN) vaginal cream Place 1 Applicatorful vaginally at bedtime for 29 days. 42.5 g 12  . famotidine (PEPCID) 20 MG tablet Take by mouth.     No current facility-administered medications for this encounter.     Physical  Findings: The patient is in no acute distress. Patient is alert and oriented.  height is 5\' 4"  (1.626 m) and weight is 173 lb 6.4 oz (78.7 kg). Her oral temperature is 98 F (36.7 C). Her blood pressure is 192/87 (abnormal) and her pulse is 75. Her respiration is 18 and oxygen saturation is 99%.   No palpable cervical, supraclavicular or axillary lymphoadenopathy. The heart has a regular rate and rhythm. The lungs are clear to auscultation. Abdomen soft and non-tender.  On pelvic examination the external genitalia were unremarkable. A speculum exam was performed. Scant blood is noted in the vaginal vault . As a erythematous small polypoid lesion noted along the right anterior lateral mid vaginal wall. This area was biopsied during the patient's surgical procedure and returned negative for malignancy (confirmed today with exam by Dr. Denman George). Vaginal cuff shows mucosal separation (confirmed by Dr. Denman George). On bimanual exam there were no pelvic masses appreciated.  Lab Findings: Lab Results  Component Value Date   WBC 8.8 03/16/2018   HGB 9.6 (L) 03/16/2018   HCT 30.3 (L) 03/16/2018   MCV 89.9 03/16/2018   PLT 230 03/16/2018    Radiographic Findings: No results found.  Impression: High grade endometrioid adenocarcinoma stage IA, pT1a, pN0. Exam today shows mucosal separation of the vaginal cuff. The light of this finding the patient's planned  high-dose-rate treatment today will be postponed.      Plan:  The patient will be placed on vaginal estrogen through Dr. Serita Grit office. She will return in approximately 3-4 weeks for examination by Dr. Denman George and myself prior to initiation of brachytherapy.  _______________________________   Blair Promise, PhD, MD

## 2018-06-01 ENCOUNTER — Telehealth: Payer: Self-pay | Admitting: Oncology

## 2018-06-01 NOTE — Telephone Encounter (Signed)
Called [place] and advised her of the joint appointment with Dr. Denman George and Dr. Sondra Come on 06/27/17 at 3:15 pm.  Discussed that she will check in for Dr. Serita Grit office.  She verbalized understanding and agreement.

## 2018-06-07 ENCOUNTER — Ambulatory Visit: Payer: Medicare Other | Admitting: Radiation Oncology

## 2018-06-14 ENCOUNTER — Ambulatory Visit: Payer: Medicare Other | Admitting: Radiation Oncology

## 2018-06-15 ENCOUNTER — Ambulatory Visit: Payer: Medicare Other | Admitting: Radiation Oncology

## 2018-06-21 ENCOUNTER — Ambulatory Visit: Payer: Medicare Other | Admitting: Radiation Oncology

## 2018-06-24 ENCOUNTER — Telehealth: Payer: Self-pay | Admitting: *Deleted

## 2018-06-24 ENCOUNTER — Telehealth: Payer: Self-pay

## 2018-06-24 NOTE — Telephone Encounter (Signed)
Incoming call from pt regarding Alexandra Haynes called her for pt to arrive at 2:30 pm on 3/3 Dr Denman George appt- pt voiced understanding and is agreeable.  No other needs at this time.

## 2018-06-24 NOTE — Telephone Encounter (Signed)
Called and left the patient a message to call the office back. Need to move her appt on 3/3 from 3:15pm to 2:30pm

## 2018-06-27 ENCOUNTER — Ambulatory Visit: Payer: Medicare Other | Admitting: Radiation Oncology

## 2018-06-27 ENCOUNTER — Ambulatory Visit: Payer: Medicare Other | Admitting: Gynecologic Oncology

## 2018-06-28 ENCOUNTER — Ambulatory Visit
Admission: RE | Admit: 2018-06-28 | Discharge: 2018-06-28 | Disposition: A | Discharge status: Home / Self Care | Payer: Medicare Other | Source: Ambulatory Visit | Attending: Radiation Oncology | Admitting: Radiation Oncology

## 2018-06-28 ENCOUNTER — Encounter: Payer: Self-pay | Admitting: Oncology

## 2018-06-28 ENCOUNTER — Ambulatory Visit: Payer: Medicare Other | Admitting: Radiation Oncology

## 2018-06-28 ENCOUNTER — Encounter: Payer: Self-pay | Admitting: Gynecologic Oncology

## 2018-06-28 ENCOUNTER — Inpatient Hospital Stay: Payer: Medicare Other | Attending: Gynecology | Admitting: Gynecologic Oncology

## 2018-06-28 ENCOUNTER — Ambulatory Visit: Payer: Medicare Other | Admitting: Gynecologic Oncology

## 2018-06-28 VITALS — BP 168/82 | HR 70 | Temp 97.7°F | Resp 18 | Ht 65.0 in | Wt 173.0 lb

## 2018-06-28 DIAGNOSIS — C541 Malignant neoplasm of endometrium: Secondary | ICD-10-CM | POA: Diagnosis present

## 2018-06-28 DIAGNOSIS — Z923 Personal history of irradiation: Secondary | ICD-10-CM | POA: Diagnosis not present

## 2018-06-28 DIAGNOSIS — Z90722 Acquired absence of ovaries, bilateral: Secondary | ICD-10-CM

## 2018-06-28 DIAGNOSIS — Z90710 Acquired absence of both cervix and uterus: Secondary | ICD-10-CM | POA: Diagnosis not present

## 2018-06-28 DIAGNOSIS — Z853 Personal history of malignant neoplasm of breast: Secondary | ICD-10-CM | POA: Insufficient documentation

## 2018-06-28 NOTE — Patient Instructions (Signed)
Please notify Dr Denman George at phone number [phone] if you notice vaginal bleeding.  Dr Denman George will see you back after completing radiation.  You do not need to use the premarin cream any more.  You have healed adequately from surgery to start on radiation.

## 2018-06-28 NOTE — Progress Notes (Signed)
  Radiation Oncology         (336) [phone] ________________________________  Name: Alexandra Haynes MRN: [id number]  Date: 06/28/2018  DOB: [birth year]  Follow-Up Visit Note  CC: [name], FNP  [name], FNP    ICD-10-CM   1. Endometrial cancer (HCC) C54.1     Diagnosis:   High grade endometrioid adenocarcinoma stage IA, pT1a, pN0    Narrative:  The patient returns today for further evaluation. The patient saw Dr. Denman George earlier today and exam revealed the vaginal cuff to be healed. No further mucosal separation noted.The patient can therefore proceed with intracavitary brachytherapy treatments. Patient denies any new medical issues since her last follow-up appointment. She denies any pelvic pain or vaginal bleeding.                           ALLERGIES:  is allergic to latex and sulfa antibiotics.  Meds: Current Outpatient Medications  Medication Sig Dispense Refill  . DULoxetine (CYMBALTA) 60 MG capsule Take 60 mg by mouth 2 (two) times daily.     . famotidine (PEPCID) 20 MG tablet Take by mouth.    . hydrochlorothiazide (HYDRODIURIL) 12.5 MG tablet Take 12.5 mg by mouth daily.  2  . losartan (COZAAR) 25 MG tablet Take 25 mg by mouth daily.     . Multiple Vitamin (MULTIVITAMIN WITH MINERALS) TABS tablet Take 1 tablet by mouth daily.    . naproxen sodium (ALEVE) 220 MG tablet Take 220-440 mg by mouth daily as needed (pain).    Sallye Lat (EYE DROPS ADVANCED RELIEF OP) Place 1 drop into both eyes daily as needed (dry eyes).     No current facility-administered medications for this encounter.     Physical Findings: The patient is in no acute distress. Patient is alert and oriented.   Lab Findings: Lab Results  Component Value Date   WBC 8.8 03/16/2018   HGB 9.6 (L) 03/16/2018   HCT 30.3 (L) 03/16/2018   MCV 89.9 03/16/2018   PLT 230 03/16/2018    Radiographic Findings: No results found.  Impression:  Patient is now ready to proceed  with vaginal brachytherapy  Plan:  She will be scheduled to begin planning and treatments later this week or early next week. She will receive 5 high-dose rate treatments directed at the vaginal cuff using iridium 192 as the high-dose-rate source.  ____________________________________ Gery Pray, MD
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
## 2018-06-29 ENCOUNTER — Telehealth: Payer: Self-pay | Admitting: Oncology

## 2018-06-29 NOTE — Telephone Encounter (Signed)
Called Alexandra Haynes and advised her that Romie Jumper, Medical Secretary will call her with the appointments for radiation.  She verbalized agreement.

## 2018-06-30 ENCOUNTER — Telehealth: Payer: Self-pay | Admitting: *Deleted

## 2018-06-30 NOTE — Telephone Encounter (Signed)
CALLED PATIENT TO INFORM OF NEW HDR [facility], LVM FOR A RETURN CALL 

## 2018-07-04 ENCOUNTER — Telehealth: Payer: Self-pay | Admitting: *Deleted

## 2018-07-04 NOTE — Telephone Encounter (Signed)
CALLED PATIENT TO REMIND OF NEW HDR [facility], LVM FOR A RETURN CALL ?

## 2018-07-05 ENCOUNTER — Encounter: Payer: Self-pay | Admitting: Radiation Oncology

## 2018-07-05 ENCOUNTER — Other Ambulatory Visit: Payer: Self-pay

## 2018-07-05 ENCOUNTER — Ambulatory Visit
Admission: RE | Admit: 2018-07-05 | Discharge: 2018-07-05 | Disposition: A | Discharge status: Home / Self Care | Payer: Medicare Other | Source: Ambulatory Visit | Attending: Radiation Oncology | Admitting: Radiation Oncology

## 2018-07-05 VITALS — BP 183/83 | HR 73 | Temp 97.7°F | Resp 18 | Ht 65.0 in | Wt 173.5 lb

## 2018-07-05 DIAGNOSIS — Z79899 Other long term (current) drug therapy: Secondary | ICD-10-CM | POA: Diagnosis not present

## 2018-07-05 DIAGNOSIS — C541 Malignant neoplasm of endometrium: Secondary | ICD-10-CM

## 2018-07-05 NOTE — Progress Notes (Signed)
Pt presents today for new [place] HDR with Dr. Sondra Come. Pt is accompanied by sister. Pt denies c/o pain. Pt denies dysuria/hematuria. Pt reports scant, white vaginal discharge odorless. Pt denies rectal bleeding, diarrhea/constipation. Pt denies abdominal bloating, N/V.   BP (!) 183/83 (BP Location: Right Arm, Patient Position: Sitting)   Pulse 73   Temp 97.7 F (36.5 C) (Oral)   Resp 18   Ht 5\' 5"  (1.651 m)   Wt 173 lb 8 oz (78.7 kg)   SpO2 100%   BMI 28.87 kg/m   Wt Readings from Last 3 Encounters:  07/05/18 173 lb 8 oz (78.7 kg)  06/28/18 173 lb (78.5 kg)  05/31/18 173 lb 6.4 oz (78.7 kg)   [name], RN BSN

## 2018-07-05 NOTE — Progress Notes (Signed)
  Radiation Oncology         [phone] ________________________________  Name: Alexandra Haynes MRN: [id number]  Date: 07/05/2018  DOB: January 11, 1946  CC: [name], FNP  Everitt Amber, MD  HDR BRACHYTHERAPY NOTE  DIAGNOSIS: High grade endometrioid adenocarcinoma stage IA,pT1a, pN0   Simple treatment device note: Patient had construction of her custom vaginal cylinder. She will be treated with a 3.0 cm diameter segmented cylinder. This conforms to her anatomy without undue discomfort.  Vaginal brachytherapy procedure node: The patient was brought to the [place] suite. Identity was confirmed. All relevant records and images related to the planned course of therapy were reviewed. The patient freely provided informed written consent to proceed with treatment after reviewing the details related to the planned course of therapy. The consent form was witnessed and verified by the simulation staff. Then, the patient was set-up in a stable reproducible supine position for radiation therapy. Pelvic exam revealed the vaginal cuff to be intact . The patient's custom vaginal cylinder was placed in the proximal vagina. This was affixed to the CT/MR stabilization plate to prevent slippage. Patient tolerated the placement well.  Verification simulation note:  A fiducial marker was placed within the vaginal cylinder. An AP and lateral film was then obtained through the pelvis area. This documented accurate position of the vaginal cylinder for treatment.  HDR BRACHYTHERAPY TREATMENT  The remote afterloading device was affixed to the vaginal cylinder by catheter. Patient then proceeded to undergo her first high-dose-rate treatment directed at the proximal vagina. The patient was prescribed a dose of 6.0 gray to be delivered to the mucosal surface. Treatment length was 3.5 cm. Patient was treated with 1 channel using 8 dwell positions. Treatment time was 347.7 seconds. Iridium 192 was the high-dose-rate source  for treatment. The patient tolerated the treatment well. After completion of her therapy, a radiation survey was performed documenting return of the iridium source into the GammaMed safe.   PLAN: she will return next week for her second high-dose-rate treatment. ________________________________  Blair Promise, PhD, MD

## 2018-07-05 NOTE — Progress Notes (Signed)
  Radiation Oncology         (336) [phone] ________________________________  Name: Alexandra Haynes MRN: [id number]  Date: 07/05/2018  DOB: May 19, 1945  Vaginal Brachytherapy Procedure Note  CC: [name], FNP Everitt Amber, MD    ICD-10-CM   1. Endometrial cancer ([facility]) C54.1     Diagnosis: High grade endometrioid adenocarcinoma stage IA,pT1a, pN0   Narrative: She returns today for vaginal cylinder fitting. She was examined by Dr. Denman George last week and the vaginal cuff is healed well. No new problems since her last exam  ALLERGIES: is allergic to latex and sulfa antibiotics.  Meds: Current Outpatient Medications  Medication Sig Dispense Refill  . DULoxetine (CYMBALTA) 60 MG capsule Take 60 mg by mouth 2 (two) times daily.     . famotidine (PEPCID) 20 MG tablet Take by mouth.    . hydrochlorothiazide (HYDRODIURIL) 25 MG tablet Take 25 mg by mouth daily.    Marland Kitchen losartan (COZAAR) 25 MG tablet Take 25 mg by mouth daily.     . Multiple Vitamin (MULTIVITAMIN WITH MINERALS) TABS tablet Take 1 tablet by mouth daily.    . naproxen sodium (ALEVE) 220 MG tablet Take 220-440 mg by mouth daily as needed (pain).    Sallye Lat (EYE DROPS ADVANCED RELIEF OP) Place 1 drop into both eyes daily as needed (dry eyes).     No current facility-administered medications for this encounter.     Physical Findings: The patient is in no acute distress. Patient is alert and oriented.  height is 5\' 5"  (1.651 m) and weight is 173 lb 8 oz (78.7 kg). Her oral temperature is 97.7 F (36.5 C). Her blood pressure is 183/83 (abnormal) and her pulse is 73. Her respiration is 18 and oxygen saturation is 100%.   No palpable cervical, supraclavicular or axillary lymphoadenopathy. The heart has a regular rate and rhythm. The lungs are clear to auscultation. Abdomen soft and non-tender.  On pelvic examination the external genitalia were unremarkable. A speculum exam was performed. Vaginal cuff  intact, no mucosal lesions. Polypoid lesion along the right lateral mid vaginal wall noted (previously biopsied). On bimanual exam there were no pelvic masses appreciated.  Lab Findings: Lab Results  Component Value Date   WBC 8.8 03/16/2018   HGB 9.6 (L) 03/16/2018   HCT 30.3 (L) 03/16/2018   MCV 89.9 03/16/2018   PLT 230 03/16/2018    Radiographic Findings: No results found.  Impression: High grade endometrioid adenocarcinoma stage IA,pT1a, pN0  Patient was fitted for a vaginal cylinder. The patient will be treated with a 3.0 cm diameter cylinder with a treatment length of 3.5 cm. This distended the vaginal vault without undue discomfort. The patient tolerated the procedure well.  The patient was successfully fitted for a vaginal cylinder. The patient is appropriate to begin vaginal brachytherapy.   Plan: The patient will proceed with CT simulation and vaginal brachytherapy today.    _______________________________   Blair Promise, PhD, MD

## 2018-07-05 NOTE — Progress Notes (Signed)
  Radiation Oncology         [phone] ________________________________  Name: Alexandra Haynes MRN: [id number]  Date: 07/05/2018  DOB: 03/16/1946  SIMULATION AND TREATMENT PLANNING NOTE HDR BRACHYTHERAPY  DIAGNOSIS:  High grade endometrioid adenocarcinoma stage IA,pT1a, pN0  NARRATIVE:  The patient was brought to the [place] suite.  Identity was confirmed.  All relevant records and images related to the planned course of therapy were reviewed.  The patient freely provided informed written consent to proceed with treatment after reviewing the details related to the planned course of therapy. The consent form was witnessed and verified by the simulation staff.  Then, the patient was set-up in a stable reproducible  supine position for radiation therapy.  CT images were obtained.  Surface markings were placed.  The CT images were loaded into the planning software.  Then the target and avoidance structures were contoured.  Treatment planning then occurred.  The radiation prescription was entered and confirmed.   I have requested : Brachytherapy Isodose Plan and Dosimetry Calculations to plan the radiation distribution.    PLAN:  The patient will receive 30 Gy in 5 fractions.  Patient will be treated with a 3 cm diameter cylinder. Iridium 192 will be the high-dose-rate source. Prescription will be to the vaginal mucosa. Prescription length will be 3.5 cm.    ________________________________  Blair Promise, PhD, MD

## 2018-07-06 ENCOUNTER — Telehealth: Payer: Self-pay | Admitting: Oncology

## 2018-07-06 NOTE — Telephone Encounter (Signed)
Alexandra Haynes left a message saying that someone called her about her cancer policy.  She said she is retired.

## 2018-07-12 ENCOUNTER — Ambulatory Visit: Payer: Medicare Other

## 2018-07-13 ENCOUNTER — Telehealth: Payer: Self-pay | Admitting: *Deleted

## 2018-07-13 NOTE — Telephone Encounter (Signed)
CALLED PATIENT TO REMIND OF HDR TX. FOR 07-14-18 @ 9AM, SPOKE WITH PATIENT AND SHE IS AWARE OF THIS [place].

## 2018-07-14 ENCOUNTER — Ambulatory Visit
Admission: RE | Admit: 2018-07-14 | Discharge: 2018-07-14 | Disposition: A | Discharge status: Home / Self Care | Payer: Medicare Other | Source: Ambulatory Visit | Attending: Radiation Oncology | Admitting: Radiation Oncology

## 2018-07-14 ENCOUNTER — Other Ambulatory Visit: Payer: Self-pay

## 2018-07-14 DIAGNOSIS — C541 Malignant neoplasm of endometrium: Secondary | ICD-10-CM | POA: Diagnosis not present

## 2018-07-14 NOTE — Progress Notes (Signed)
  Radiation Oncology         [phone] ________________________________  Name: Alexandra Haynes MRN: [id number]  Date: 07/14/2018  DOB: May 13, 1945  CC: [name], FNP  Everitt Amber, MD  HDR BRACHYTHERAPY NOTE  DIAGNOSIS: 73 y.o. female with High grade endometrioid adenocarcinoma stage IA,pT1a, pN0   Simple treatment device note: Patient had construction of her custom vaginal cylinder. She will be treated with a 3.0 cm diameter segmented cylinder. This conforms to her anatomy without undue discomfort.  Vaginal brachytherapy procedure node: The patient was brought to the [place] [place] suite. Identity was confirmed. All relevant records and images related to the planned course of therapy were reviewed. The patient freely provided informed written consent to proceed with treatment after reviewing the details related to the planned course of therapy. The consent form was witnessed and verified by the simulation staff. Then, the patient was set-up in a stable reproducible supine position for radiation therapy. Pelvic exam revealed the vaginal cuff to be intact. The patient's custom vaginal cylinder was placed in the proximal vagina. This was affixed to the CT/MR stabilization plate to prevent slippage. Patient tolerated the placement well.  Verification simulation note:  A fiducial marker was placed within the vaginal cylinder. An AP and lateral film was then obtained through the pelvis area. This documented accurate position of the vaginal cylinder for treatment.  HDR BRACHYTHERAPY TREATMENT  The remote afterloading device was affixed to the vaginal cylinder by catheter. Patient then proceeded to undergo her second high-dose-rate treatment directed at the proximal vagina. The patient was prescribed a dose of 6.0 gray to be delivered to the mucosal surface. Treatment length was 3.5 cm. Patient was treated with 1 channel using 8 dwell positions. Treatment time was 378.2 seconds. Iridium 192 was the  high-dose-rate source for treatment. The patient tolerated the treatment well. After completion of her therapy, a radiation survey was performed documenting return of the iridium source into the GammaMed safe.   PLAN: The patient will return on 07/21/2018 for her third HDR treatment. ________________________________  Blair Promise, PhD, MD   This document serves as a record of services personally performed by Gery Pray, MD. It was created on his behalf by Rae Lips, a trained medical scribe. The creation of this record is based on the scribe's personal observations and the provider's statements to them. This document has been checked and approved by the attending provider.

## 2018-07-19 ENCOUNTER — Ambulatory Visit: Payer: Medicare Other

## 2018-07-20 ENCOUNTER — Telehealth: Payer: Self-pay | Admitting: *Deleted

## 2018-07-20 NOTE — Telephone Encounter (Signed)
CALLED PATIENT TO REMIND OF HDR [place] 07-21-18 @ 2 PM, SPOKE WITH PATIENT AND SHE IS AWARE OF THIS [place].

## 2018-07-21 ENCOUNTER — Other Ambulatory Visit: Payer: Self-pay

## 2018-07-21 ENCOUNTER — Ambulatory Visit
Admission: RE | Admit: 2018-07-21 | Discharge: 2018-07-21 | Disposition: A | Discharge status: Home / Self Care | Payer: Medicare Other | Source: Ambulatory Visit | Attending: Radiation Oncology | Admitting: Radiation Oncology

## 2018-07-21 DIAGNOSIS — C541 Malignant neoplasm of endometrium: Secondary | ICD-10-CM

## 2018-07-21 NOTE — Progress Notes (Signed)
  Radiation Oncology         (336) [phone] ________________________________  Name: Alexandra Haynes MRN: [id number]  Date: 07/21/2018  DOB: April 07, 1946  CC: [name], FNP  Everitt Amber, MD  HDR BRACHYTHERAPY NOTE  DIAGNOSIS: 73 y.o. female with High grade endometrioid adenocarcinoma stage IA,pT1a, pN0   Simple treatment device note: Patient had construction of her custom vaginal cylinder. She will be treated with a 3.0 cm diameter segmented cylinder. This conforms to her anatomy without undue discomfort.  Vaginal brachytherapy procedure node: The patient was brought to the [place] suite. Identity was confirmed. All relevant records and images related to the planned course of therapy were reviewed. The patient freely provided informed written consent to proceed with treatment after reviewing the details related to the planned course of therapy. The consent form was witnessed and verified by the simulation staff. Then, the patient was set-up in a stable reproducible supine position for radiation therapy. Pelvic exam revealed the vaginal cuff to be intact. The patient's custom vaginal cylinder was placed in the proximal vagina. This was affixed to the CT/MR stabilization plate to prevent slippage. Patient tolerated the placement well.  Verification simulation note:  A fiducial marker was placed within the vaginal cylinder. An AP and lateral film was then obtained through the pelvis area. This documented accurate position of the vaginal cylinder for treatment.  HDR BRACHYTHERAPY TREATMENT  The remote afterloading device was affixed to the vaginal cylinder by catheter. Patient then proceeded to undergo her third high-dose-rate treatment directed at the proximal vagina. The patient was prescribed a dose of 6.0 gray to be delivered to the mucosal surface. Treatment length was 3.5 cm. Patient was treated with 1 channel using 8 dwell positions. Treatment time was 404.10 seconds. Iridium 192 was the  high-dose-rate source for treatment. The patient tolerated the treatment well. After completion of her therapy, a radiation survey was performed documenting return of the iridium source into the GammaMed safe.   PLAN: The patient will return on 07/28/2018 for her fourth HDR treatment. ________________________________  Blair Promise, PhD, MD   This document serves as a record of services personally performed by Gery Pray, MD. It was created on his behalf by Rae Lips, a trained medical scribe. The creation of this record is based on the scribe's personal observations and the provider's statements to them. This document has been checked and approved by the attending provider.

## 2018-07-26 ENCOUNTER — Ambulatory Visit: Payer: Medicare Other

## 2018-07-27 ENCOUNTER — Telehealth: Payer: Self-pay | Admitting: *Deleted

## 2018-07-27 NOTE — Telephone Encounter (Signed)
Called patient to remind of HDR Tx. for 07-28-18 @ 2 pm, spoke with patient and she is aware of this appt.

## 2018-07-28 ENCOUNTER — Ambulatory Visit
Admission: RE | Admit: 2018-07-28 | Discharge: 2018-07-28 | Disposition: A | Discharge status: Home / Self Care | Payer: Medicare Other | Source: Ambulatory Visit | Attending: Radiation Oncology | Admitting: Radiation Oncology

## 2018-07-28 ENCOUNTER — Other Ambulatory Visit: Payer: Self-pay

## 2018-07-28 DIAGNOSIS — C541 Malignant neoplasm of endometrium: Secondary | ICD-10-CM

## 2018-07-28 NOTE — Progress Notes (Signed)
  Radiation Oncology         [phone] ________________________________  Name: Alexandra Haynes MRN: [id number]  Date: 07/28/2018  DOB: [birth year]  CC: [name], FNP  Everitt Amber, MD  HDR BRACHYTHERAPY NOTE  DIAGNOSIS: 73 y.o. female with High grade endometrioid adenocarcinoma stage IA,pT1a, pN0   Simple treatment device note: Patient had construction of her custom vaginal cylinder. She will be treated with a 3.0 cm diameter segmented cylinder. This conforms to her anatomy without undue discomfort.  Vaginal brachytherapy procedure node: The patient was brought to the [place] suite. Identity was confirmed. All relevant records and images related to the planned course of therapy were reviewed. The patient freely provided informed written consent to proceed with treatment after reviewing the details related to the planned course of therapy. The consent form was witnessed and verified by the simulation staff. Then, the patient was set-up in a stable reproducible supine position for radiation therapy. Pelvic exam revealed the vaginal cuff to be intact. The patient's custom vaginal cylinder was placed in the proximal vagina. This was affixed to the CT/MR stabilization plate to prevent slippage. Patient tolerated the placement well.  Verification simulation note:  A fiducial marker was placed within the vaginal cylinder. An AP and lateral film was then obtained through the pelvis area. This documented accurate position of the vaginal cylinder for treatment.  HDR BRACHYTHERAPY TREATMENT  The remote afterloading device was affixed to the vaginal cylinder by catheter. Patient then proceeded to undergo her fourth high-dose-rate treatment directed at the proximal vagina. The patient was prescribed a dose of 6.0 gray to be delivered to the mucosal surface. Treatment length was 3.5 cm. Patient was treated with 1 channel using 8 dwell positions. Treatment time was 431.60 seconds. Iridium 192 was the  high-dose-rate source for treatment. The patient tolerated the treatment well. After completion of her therapy, a radiation survey was performed documenting return of the iridium source into the GammaMed safe.   PLAN: The patient will return on 08/04/2018 for her fifth and final HDR treatment. ________________________________  Blair Promise, PhD, MD   This document serves as a record of services personally performed by Gery Pray, MD. It was created on his behalf by Rae Lips, a trained medical scribe. The creation of this record is based on the scribe's personal observations and the provider's statements to them. This document has been checked and approved by the attending provider.

## 2018-08-02 ENCOUNTER — Ambulatory Visit: Payer: Medicare Other

## 2018-08-03 ENCOUNTER — Encounter: Payer: Self-pay | Admitting: Radiation Oncology

## 2018-08-03 NOTE — Progress Notes (Signed)
  Radiation Oncology         [phone]) [phone] ________________________________  Name: Alexandra Haynes MRN: [id number]  Date: 08/03/2018  DOB: 24-May-1945  End of Treatment Note  Diagnosis:   High grade endometrioid adenocarcinoma stage IA,pT1a, pN0     Indication for treatment:  Curative       Radiation treatment dates:   07/05/18, 07/14/18, 07/21/18, 07/28/18,  08/04/18  Site/dose:  Vagina, 6 Gy in 5 fractions for a total dose of 30 Gy  Beams/energy:   HDR Ir-Vaginal, Iridium HDR, patient was treated with a 3 cm diameter segmented cylinder, Rx length of 3.5 cm, prescription was 6 gray to the mucosal surface  Narrative: The patient tolerated radiation treatment relatively well.     At the beginning of treatment, pt reported odorless white vaginal discharge. Pt denied dysuria/hematuria, rectal bleeding, diarrhea/constipation, abdominal bloating, and N/V throughout treatments. Overall the pt was without complaints.   Plan: The patient has completed radiation treatment. The patient will return to radiation oncology clinic for routine followup in one month. I advised them to call or return sooner if they have any questions or concerns related to their recovery or treatment.  -----------------------------------  Blair Promise, PhD, MD  This document serves as a record of services personally performed by Gery Pray, MD. It was created on his behalf by Mary-Margaret Loma Messing, a trained medical scribe. The creation of this record is based on the scribe's personal observations and the provider's statements to them. This document has been checked and approved by the attending provider.

## 2018-08-04 ENCOUNTER — Other Ambulatory Visit: Payer: Self-pay

## 2018-08-04 ENCOUNTER — Ambulatory Visit
Admission: RE | Admit: 2018-08-04 | Discharge: 2018-08-04 | Disposition: A | Discharge status: Home / Self Care | Payer: Medicare Other | Source: Ambulatory Visit | Attending: Radiation Oncology | Admitting: Radiation Oncology

## 2018-08-04 DIAGNOSIS — C541 Malignant neoplasm of endometrium: Secondary | ICD-10-CM

## 2018-08-04 NOTE — Progress Notes (Signed)
  Radiation Oncology         [phone] ________________________________  Name: Alexandra Haynes MRN: [id number]  Date: 08/04/2018  DOB: [birth year]  CC: [name], FNP  Everitt Amber, MD  HDR BRACHYTHERAPY NOTE  DIAGNOSIS: 73 y.o. female with High grade endometrioid adenocarcinoma stage IA,pT1a, pN0   Simple treatment device note: Patient had construction of her custom vaginal cylinder. She will be treated with a 3.0 cm diameter segmented cylinder. This conforms to her anatomy without undue discomfort.  Vaginal brachytherapy procedure node: The patient was brought to the [facility] suite. Identity was confirmed. All relevant records and images related to the planned course of therapy were reviewed. The patient freely provided informed written consent to proceed with treatment after reviewing the details related to the planned course of therapy. The consent form was witnessed and verified by the simulation staff. Then, the patient was set-up in a stable reproducible supine position for radiation therapy. Pelvic exam revealed the vaginal cuff to be intact. The patient's custom vaginal cylinder was placed in the proximal vagina. This was affixed to the CT/MR stabilization plate to prevent slippage. Patient tolerated the placement well.  Verification simulation note:  A fiducial marker was placed within the vaginal cylinder. An AP and lateral film was then obtained through the pelvis area. This documented accurate position of the vaginal cylinder for treatment.  HDR BRACHYTHERAPY TREATMENT  The remote afterloading device was affixed to the vaginal cylinder by catheter. Patient then proceeded to undergo her fifth and final high-dose-rate treatment directed at the proximal vagina. The patient was prescribed a dose of 6.0 gray to be delivered to the mucosal surface. Treatment length was 3.5 cm. Patient was treated with 1 channel using 7 dwell positions. Treatment time was 194.60 seconds. Iridium 192  was the high-dose-rate source for treatment. The patient tolerated the treatment well. After completion of her therapy, a radiation survey was performed documenting return of the iridium source into the [employer] safe.   PLAN: The patient has completed HDR treatment. Due to the current COVID-19 pandemic, we will follow up with the patient by telephone in one month unless she has problems. The patient was given a vaginal dilator and instructions on its use. The patient will see Dr. Denman George in June for follow-up and return to radiation oncology in September for routine follow-up. ________________________________  Blair Promise, PhD, MD   This document serves as a record of services personally performed by Gery Pray, MD. It was created on his behalf by Rae Lips, a trained medical scribe. The creation of this record is based on the scribe's personal observations and the provider's statements to them. This document has been checked and approved by the attending provider.

## 2018-09-30 ENCOUNTER — Encounter: Payer: Self-pay | Admitting: Gynecologic Oncology

## 2018-09-30 ENCOUNTER — Other Ambulatory Visit: Payer: Self-pay

## 2018-09-30 ENCOUNTER — Telehealth: Payer: Self-pay | Admitting: Gynecologic Oncology

## 2018-09-30 ENCOUNTER — Inpatient Hospital Stay: Payer: Medicare Other

## 2018-09-30 ENCOUNTER — Telehealth: Payer: Self-pay | Admitting: *Deleted

## 2018-09-30 ENCOUNTER — Inpatient Hospital Stay: Payer: Medicare Other | Attending: Gynecology | Admitting: Gynecologic Oncology

## 2018-09-30 VITALS — BP 168/70 | HR 77 | Temp 98.0°F | Resp 18 | Ht 65.0 in | Wt 174.7 lb

## 2018-09-30 DIAGNOSIS — M353 Polymyalgia rheumatica: Secondary | ICD-10-CM | POA: Insufficient documentation

## 2018-09-30 DIAGNOSIS — R42 Dizziness and giddiness: Secondary | ICD-10-CM

## 2018-09-30 DIAGNOSIS — K219 Gastro-esophageal reflux disease without esophagitis: Secondary | ICD-10-CM | POA: Diagnosis not present

## 2018-09-30 DIAGNOSIS — Z923 Personal history of irradiation: Secondary | ICD-10-CM

## 2018-09-30 DIAGNOSIS — R531 Weakness: Secondary | ICD-10-CM

## 2018-09-30 DIAGNOSIS — C541 Malignant neoplasm of endometrium: Secondary | ICD-10-CM

## 2018-09-30 DIAGNOSIS — M5136 Other intervertebral disc degeneration, lumbar region: Secondary | ICD-10-CM | POA: Insufficient documentation

## 2018-09-30 DIAGNOSIS — F329 Major depressive disorder, single episode, unspecified: Secondary | ICD-10-CM | POA: Insufficient documentation

## 2018-09-30 DIAGNOSIS — Z90710 Acquired absence of both cervix and uterus: Secondary | ICD-10-CM | POA: Diagnosis not present

## 2018-09-30 DIAGNOSIS — Z853 Personal history of malignant neoplasm of breast: Secondary | ICD-10-CM | POA: Insufficient documentation

## 2018-09-30 DIAGNOSIS — Z8249 Family history of ischemic heart disease and other diseases of the circulatory system: Secondary | ICD-10-CM | POA: Diagnosis not present

## 2018-09-30 DIAGNOSIS — H8109 Meniere's disease, unspecified ear: Secondary | ICD-10-CM | POA: Insufficient documentation

## 2018-09-30 DIAGNOSIS — Z90722 Acquired absence of ovaries, bilateral: Secondary | ICD-10-CM | POA: Diagnosis not present

## 2018-09-30 LAB — CBC WITH DIFFERENTIAL (CANCER CENTER ONLY)
Abs Immature Granulocytes: 0.03 10*3/uL (ref 0.00–0.07)
Basophils Absolute: 0 10*3/uL (ref 0.0–0.1)
Basophils Relative: 1 %
Eosinophils Absolute: 0 10*3/uL (ref 0.0–0.5)
Eosinophils Relative: 1 %
HCT: 35.5 % — ABNORMAL LOW (ref 36.0–46.0)
Hemoglobin: 11.4 g/dL — ABNORMAL LOW (ref 12.0–15.0)
Immature Granulocytes: 1 %
Lymphocytes Relative: 20 %
Lymphs Abs: 1.2 10*3/uL (ref 0.7–4.0)
MCH: 28.4 pg (ref 26.0–34.0)
MCHC: 32.1 g/dL (ref 30.0–36.0)
MCV: 88.5 fL (ref 80.0–100.0)
Monocytes Absolute: 0.5 10*3/uL (ref 0.1–1.0)
Monocytes Relative: 9 %
Neutro Abs: 4.1 10*3/uL (ref 1.7–7.7)
Neutrophils Relative %: 68 %
Platelet Count: 223 10*3/uL (ref 150–400)
RBC: 4.01 MIL/uL (ref 3.87–5.11)
RDW: 13.8 % (ref 11.5–15.5)
WBC Count: 6 10*3/uL (ref 4.0–10.5)
nRBC: 0 % (ref 0.0–0.2)

## 2018-09-30 LAB — BASIC METABOLIC PANEL
Anion gap: 12 (ref 5–15)
BUN: 14 mg/dL (ref 8–23)
CO2: 26 mmol/L (ref 22–32)
Calcium: 9.3 mg/dL (ref 8.9–10.3)
Chloride: 98 mmol/L (ref 98–111)
Creatinine, Ser: 1.06 mg/dL — ABNORMAL HIGH (ref 0.44–1.00)
GFR calc Af Amer: 60 mL/min — ABNORMAL LOW (ref >60)
GFR calc non Af Amer: 52 mL/min — ABNORMAL LOW (ref >60)
Glucose, Bld: 86 mg/dL (ref 70–99)
Potassium: 3.8 mmol/L (ref 3.5–5.1)
Sodium: 136 mmol/L (ref 135–145)
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
## 2018-09-30 NOTE — Telephone Encounter (Signed)
CALLED PATIENT TO INFORM OF FU APPT. WITH DR. KINARD ON 01-09-19 @ 11 AM, LVM FOR A RETURN CALL

## 2018-09-30 NOTE — Patient Instructions (Signed)
Dr Denman George is checking labs today to evaluate your weakness symptoms. If these are normal, she recommends that you follow-up with your primary doctor to have these symptoms worked up further.  Please notify Dr Denman George at phone number [phone] if you notice vaginal bleeding, new pelvic or abdominal pains, bloating, feeling full easy, or a change in bladder or bowel function.   Please contact Dr Serita Grit office (at [phone]) in September after your appointment with Dr Sondra Come (or have his office call) to request an appointment with her for December, 2020.

## 2018-09-30 NOTE — Telephone Encounter (Signed)
Called patient and left message with lab results.  Labs were reviewed by Dr. Denman George. Advised her to make sure she is staying hydrated. Labs will also be routed to PCP. Advised her to call for any questions or concerns.

## 2018-10-03 ENCOUNTER — Telehealth: Payer: Self-pay | Admitting: *Deleted

## 2018-10-03 ENCOUNTER — Telehealth: Payer: Self-pay

## 2018-10-03 NOTE — Telephone Encounter (Signed)
Pt calling to ask if she still needs to use vaginal dilator. Conveyed she needs to use dilator at least until her next f/u appt. Pt verbalized understanding and agreement. [name], RN BSN

## 2018-10-03 NOTE — Telephone Encounter (Signed)
RETURNED PATIENT'S PHONE CALL, LVM  

## 2018-11-29 ENCOUNTER — Other Ambulatory Visit: Payer: Self-pay | Admitting: Family Medicine

## 2019-01-09 ENCOUNTER — Ambulatory Visit
Admission: RE | Admit: 2019-01-09 | Discharge: 2019-01-09 | Disposition: A | Discharge status: Home / Self Care | Payer: Medicare Other | Source: Ambulatory Visit | Attending: Radiation Oncology | Admitting: Radiation Oncology

## 2019-01-09 ENCOUNTER — Other Ambulatory Visit: Payer: Self-pay

## 2019-01-09 ENCOUNTER — Encounter: Payer: Self-pay | Admitting: Radiation Oncology

## 2019-01-09 VITALS — BP 165/80 | HR 74 | Temp 98.5°F | Resp 18 | Wt 175.0 lb

## 2019-01-09 DIAGNOSIS — Z923 Personal history of irradiation: Secondary | ICD-10-CM | POA: Diagnosis not present

## 2019-01-09 DIAGNOSIS — Z79899 Other long term (current) drug therapy: Secondary | ICD-10-CM | POA: Diagnosis not present

## 2019-01-09 DIAGNOSIS — C541 Malignant neoplasm of endometrium: Secondary | ICD-10-CM

## 2019-01-09 DIAGNOSIS — Z8542 Personal history of malignant neoplasm of other parts of uterus: Secondary | ICD-10-CM | POA: Insufficient documentation

## 2019-01-09 DIAGNOSIS — K5900 Constipation, unspecified: Secondary | ICD-10-CM | POA: Insufficient documentation

## 2019-01-09 NOTE — Progress Notes (Signed)
Pt presents today for f/u with Dr. Sondra Come. Pt denies c/o pain. Pt denies dysuria/hematuria. Pt denies vaginal bleeding. Pt reports scant vaginal discharge, clear, "a few weeks back". Pt denies any associated burning or itching with discharge. Pt denies rectal bleeding. Pt reports occasional constipation that is relieved by PRN use of Miralax. Pt reports occasional abdominal bloating associated with constipation. Pt denies N/V.  BP (!) 165/80 (BP Location: Right Leg, Patient Position: Sitting)   Pulse 74   Temp 98.5 F (36.9 C) (Temporal)   Resp 18   Wt 175 lb (79.4 kg)   SpO2 100%   BMI 29.12 kg/m   Wt Readings from Last 3 Encounters:  01/09/19 175 lb (79.4 kg)  09/30/18 174 lb 11.2 oz (79.2 kg)  07/05/18 173 lb 8 oz (78.7 kg)   [name], RN BSN

## 2019-01-09 NOTE — Patient Instructions (Signed)
Coronavirus (COVID-19) Are you at risk?  Are you at risk for the Coronavirus (COVID-19)?  To be considered HIGH RISK for Coronavirus (COVID-19), you have to meet the following criteria:  . Traveled to [place], [place], [place], [place] or [place]; or in the [place] to [place], [place], [place], or [place]; and have fever, cough, and shortness of breath within the last 2 weeks of travel OR . Been in close contact with a person diagnosed with COVID-19 within the last 2 weeks and have fever, cough, and shortness of breath . IF YOU DO NOT MEET THESE CRITERIA, YOU ARE CONSIDERED LOW RISK FOR COVID-19.  What to do if you are HIGH RISK for COVID-19?  . If you are having a medical emergency, call 911. . Seek medical care right away. Before you go to a doctor's office, urgent care or emergency department, call ahead and tell them about your recent travel, contact with someone diagnosed with COVID-19, and your symptoms. You should receive instructions from your physician's office regarding next steps of care.  . When you arrive at healthcare provider, tell the healthcare staff immediately you have returned from visiting [place], [place], [place], [place] or [place]; or traveled in the [place] to [place], [place], [place], or [place]; in the last two weeks or you have been in close contact with a person diagnosed with COVID-19 in the last 2 weeks.   . Tell the health care staff about your symptoms: fever, cough and shortness of breath. . After you have been seen by a medical provider, you will be either: o Tested for (COVID-19) and discharged home on quarantine except to seek medical care if symptoms worsen, and asked to  - Stay home and avoid contact with others until you get your results (4-5 days)  - Avoid travel on public transportation if possible (such as bus, train, or airplane) or o Sent to the Emergency Department by EMS for evaluation, COVID-19 testing, and possible  admission depending on your condition and test results.  What to do if you are LOW RISK for COVID-19?  Reduce your risk of any infection by using the same precautions used for avoiding the common cold or flu:  . Wash your hands often with soap and warm water for at least 20 seconds.  If soap and water are not readily available, use an alcohol-based hand sanitizer with at least 60% alcohol.  . If coughing or sneezing, cover your mouth and nose by coughing or sneezing into the elbow areas of your shirt or coat, into a tissue or into your sleeve (not your hands). . Avoid shaking hands with others and consider head nods or verbal greetings only. . Avoid touching your eyes, nose, or mouth with unwashed hands.  . Avoid close contact with people who are sick. . Avoid places or events with large numbers of people in one location, like concerts or sporting events. . Carefully consider travel plans you have or are making. . If you are planning any travel outside or inside the [place], visit the CDC's Travelers' Health webpage for the latest health notices. . If you have some symptoms but not all symptoms, continue to monitor at home and seek medical attention if your symptoms worsen. . If you are having a medical emergency, call 911.   ADDITIONAL HEALTHCARE OPTIONS FOR PATIENTS  [facility] / e-Visit: [email or web address]         [facility]: [phone]  [facility]: [phone]                   [facility]: [phone]   

## 2019-01-09 NOTE — Progress Notes (Signed)
Radiation Oncology         (336) [phone] ________________________________  Name: Alexandra Haynes MRN: [id number]  Date: 01/09/2019  DOB: 1946/02/12  Follow-Up Visit Note  CC: [name], FNP  Everitt Amber, MD    ICD-10-CM   1. Endometrial cancer ([place])  C54.1     Diagnosis:  Stage IA,pT1a, pN0 high grade endometrioid adenocarcinoma   Interval Since Last Radiation:  5 months   3/10, 3/19, 3/26, 4/2, 08/04/2018: Vaginal Cuff (HDR brachytherapy; 3 cm cylinder, 3.5 cm length) / 30 Gy in 5 fractions  Narrative:  The patient returns today for routine follow-up. She last saw Dr. Denman George on 09/30/2018. Pelvic exam at that time was stable.    On review of systems, she reports scant, clear vaginal discharge a few weeks ago that has since resolved. She denies any associated burning or itching with the discharge. She also reports occasional constipation, which is relieved by Miralax, with associated abdominal bloating. She denies pain, dysuria or hematuria, vaginal bleeding, rectal bleeding, and nausea or vomiting.  ALLERGIES:  is allergic to latex; sulfa antibiotics; and phenazopyridine.  Meds: Current Outpatient Medications  Medication Sig Dispense Refill  . DULoxetine (CYMBALTA) 60 MG capsule Take 60 mg by mouth 2 (two) times daily.     . hydrochlorothiazide (HYDRODIURIL) 25 MG tablet Take 25 mg by mouth daily.    Marland Kitchen losartan (COZAAR) 25 MG tablet Take 25 mg by mouth daily.     . Multiple Vitamin (MULTIVITAMIN WITH MINERALS) TABS tablet Take 1 tablet by mouth daily.    . naproxen sodium (ALEVE) 220 MG tablet Take 220-440 mg by mouth daily as needed (pain).    Sallye Lat (EYE DROPS ADVANCED RELIEF OP) Place 1 drop into both eyes daily as needed (dry eyes).    . famotidine (PEPCID) 20 MG tablet Take by mouth.     No current facility-administered medications for this encounter.     Physical Findings: The patient is in no acute distress. Patient is alert and oriented.   weight is 175 lb (79.4 kg). Her temporal temperature is 98.5 F (36.9 C). Her blood pressure is 165/80 (abnormal) and her pulse is 74. Her respiration is 18 and oxygen saturation is 100%. .  No significant changes. Lungs are clear to auscultation bilaterally. Heart has regular rate and rhythm. No palpable cervical, supraclavicular, or axillary adenopathy. Abdomen soft, non-tender, normal bowel sounds. On pelvic examination the external genitalia were unremarkable. A speculum exam was performed. Benign lesion noted on the right lateral upper vaginal wall, which appears stable in size.  No mucosal lesions noted in the vaginal vault. On Bimanual Examination no pelvic masses were appreciated.  Vaginal cuff intact.  Lab Findings: Lab Results  Component Value Date   WBC 6.0 09/30/2018   HGB 11.4 (L) 09/30/2018   HCT 35.5 (L) 09/30/2018   MCV 88.5 09/30/2018   PLT 223 09/30/2018    Radiographic Findings: No results found.  Impression:    No evidence of recurrence on clinical exam.  Plan:  Patient will follow up with Dr. Denman George in 3 months and radiation oncology in 6 months.  ____________________________________ Gery Pray, MD   This document serves as a record of services personally performed by Gery Pray, MD. It was created on his behalf by Wilburn Mylar, a trained medical scribe. The creation of this record is based on the scribe's personal observations and the provider's statements to them. This document has been checked and approved by the attending provider.

## 2019-01-10 ENCOUNTER — Telehealth: Payer: Self-pay | Admitting: *Deleted

## 2019-01-10 NOTE — Telephone Encounter (Signed)
Scheduled patient to see Dr. Denman George in December

## 2019-04-06 ENCOUNTER — Inpatient Hospital Stay: Payer: Medicare Other | Attending: Gynecologic Oncology | Admitting: Gynecologic Oncology

## 2019-04-06 ENCOUNTER — Other Ambulatory Visit: Payer: Self-pay

## 2019-04-06 ENCOUNTER — Encounter: Payer: Self-pay | Admitting: Gynecologic Oncology

## 2019-04-06 VITALS — BP 181/55 | HR 75 | Temp 98.7°F | Resp 16 | Ht 65.0 in | Wt 171.5 lb

## 2019-04-06 DIAGNOSIS — Z79899 Other long term (current) drug therapy: Secondary | ICD-10-CM | POA: Insufficient documentation

## 2019-04-06 DIAGNOSIS — F329 Major depressive disorder, single episode, unspecified: Secondary | ICD-10-CM | POA: Diagnosis not present

## 2019-04-06 DIAGNOSIS — Z90710 Acquired absence of both cervix and uterus: Secondary | ICD-10-CM | POA: Insufficient documentation

## 2019-04-06 DIAGNOSIS — C541 Malignant neoplasm of endometrium: Secondary | ICD-10-CM | POA: Diagnosis present

## 2019-04-06 DIAGNOSIS — Z90722 Acquired absence of ovaries, bilateral: Secondary | ICD-10-CM | POA: Diagnosis not present

## 2019-04-06 DIAGNOSIS — F419 Anxiety disorder, unspecified: Secondary | ICD-10-CM | POA: Diagnosis not present

## 2019-04-06 DIAGNOSIS — Z853 Personal history of malignant neoplasm of breast: Secondary | ICD-10-CM | POA: Diagnosis not present

## 2019-04-06 DIAGNOSIS — Z923 Personal history of irradiation: Secondary | ICD-10-CM | POA: Diagnosis not present
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
## 2019-04-06 NOTE — Patient Instructions (Signed)
Please notify Dr Denman George at phone number [phone] if you notice vaginal bleeding, new pelvic or abdominal pains, bloating, feeling full easy, or a change in bladder or bowel function.   Dr Denman George will see you again in 6 months.

## 2019-05-02 ENCOUNTER — Ambulatory Visit: Payer: Medicare Other | Attending: Internal Medicine

## 2019-05-02 ENCOUNTER — Other Ambulatory Visit: Payer: Self-pay

## 2019-05-02 DIAGNOSIS — Z20822 Contact with and (suspected) exposure to covid-19: Secondary | ICD-10-CM

## 2019-05-04 LAB — NOVEL CORONAVIRUS, NAA: SARS-CoV-2, NAA: NOT DETECTED

## 2019-06-12 ENCOUNTER — Encounter: Payer: Self-pay | Admitting: Radiation Oncology

## 2019-06-12 ENCOUNTER — Ambulatory Visit
Admission: RE | Admit: 2019-06-12 | Discharge: 2019-06-12 | Disposition: A | Discharge status: Home / Self Care | Payer: Medicare Other | Source: Ambulatory Visit | Attending: Radiation Oncology | Admitting: Radiation Oncology

## 2019-06-12 ENCOUNTER — Other Ambulatory Visit: Payer: Self-pay

## 2019-06-12 VITALS — BP 171/72 | HR 75 | Temp 98.5°F | Resp 20 | Wt 171.4 lb

## 2019-06-12 DIAGNOSIS — Z923 Personal history of irradiation: Secondary | ICD-10-CM | POA: Insufficient documentation

## 2019-06-12 DIAGNOSIS — C541 Malignant neoplasm of endometrium: Secondary | ICD-10-CM

## 2019-06-12 DIAGNOSIS — Z8542 Personal history of malignant neoplasm of other parts of uterus: Secondary | ICD-10-CM | POA: Diagnosis not present

## 2019-06-12 DIAGNOSIS — Z79899 Other long term (current) drug therapy: Secondary | ICD-10-CM | POA: Diagnosis not present

## 2019-06-12 NOTE — Progress Notes (Signed)
Radiation Oncology         [phone] ________________________________  Name: Alexandra Haynes MRN: [id number]  Date: 06/12/2019  DOB: Aug 30, 1945  Follow-Up Visit Note  CC: Patient, No Pcp Per  Everitt Amber, MD    ICD-10-CM   1. Endometrial cancer (H[facility])  C54.1     Diagnosis:  Stage IA,pT1a, pN0 high grade endometrioid adenocarcinoma   Interval Since Last Radiation: Ten months and six days.  3/10, 3/19, 3/26, 4/2, 08/04/2018: Vaginal Cuff (HDR brachytherapy; 3 cm cylinder, 3.5 cm length) / 30 Gy in 5 fractions  Narrative: The patient returns today for routine follow-up. Since last visit, the patient followed up with Dr. Denman George on 04/06/2019, during which time her pelvic exam showed a stable, benign right upper vaginal wall nodule. There was no blood, lesions, or masses noted. No clinical signs of recurrence at that time.  On review of systems, the patient reports occasional abdominal bloating related to food. The patient denies vaginal bleeding or pelvic pain.  She denies any diarrhea or rectal bleeding..  ALLERGIES:  is allergic to latex; sulfa antibiotics; and phenazopyridine.  Meds: Current Outpatient Medications  Medication Sig Dispense Refill  . Calcium Carbonate-Vitamin D 600-200 MG-UNIT TABS Take by mouth.    . DULoxetine (CYMBALTA) 60 MG capsule Take 60 mg by mouth 2 (two) times daily.     . hydrochlorothiazide (HYDRODIURIL) 25 MG tablet Take 25 mg by mouth daily.    Marland Kitchen losartan (COZAAR) 25 MG tablet Take 25 mg by mouth daily.     . Multiple Vitamin (MULTIVITAMIN WITH MINERALS) TABS tablet Take 1 tablet by mouth daily.    . Multiple Vitamins-Minerals (THERA-M) TABS Take by mouth.    . naproxen sodium (ALEVE) 220 MG tablet Take 220-440 mg by mouth daily as needed (pain).    Sallye Lat (EYE DROPS ADVANCED RELIEF OP) Place 1 drop into both eyes daily as needed (dry eyes).    . famotidine (PEPCID) 20 MG tablet Take by mouth.     No current  facility-administered medications for this encounter.    Physical Findings: The patient is in no acute distress. Patient is alert and oriented.  weight is 171 lb 6.4 oz (77.7 kg). Her temperature is 98.5 F (36.9 C). Her blood pressure is 171/72 (abnormal) and her pulse is 75. Her respiration is 20 and oxygen saturation is 100%. .  No significant changes. Lungs are clear to auscultation bilaterally. Heart has regular rate and rhythm. No palpable cervical, supraclavicular, or axillary adenopathy. Abdomen soft, non-tender, normal bowel sounds. On pelvic examination the external genitalia were unremarkable. A speculum exam was performed. Benign lesion noted on the right lateral upper vaginal wall, which appears stable in size.  No mucosal lesions noted in the vaginal vault. On Bimanual Examination no pelvic masses were appreciated.  Vaginal cuff intact.   Lab Findings: Lab Results  Component Value Date   WBC 6.0 09/30/2018   HGB 11.4 (L) 09/30/2018   HCT 35.5 (L) 09/30/2018   MCV 88.5 09/30/2018   PLT 223 09/30/2018    Radiographic Findings: No results found.  Impression: Stage IA,pT1a, pN0 high grade endometrioid adenocarcinoma   No evidence of recurrence on clinical exam.  Plan: Patient is scheduled to follow up with Dr. Denman George on 10/10/2019. She will follow up with radiation oncology in six months.  ____________________________________   Blair Promise, PhD, MD  This document serves as a record of services personally performed by Gery Pray, MD. It was created on  his behalf by Clerance Lav, a trained medical scribe. The creation of this record is based on the scribe's personal observations and the provider's statements to them. This document has been checked and approved by the attending provider.

## 2019-06-12 NOTE — Patient Instructions (Signed)
Coronavirus (COVID-19) Are you at risk?  Are you at risk for the Coronavirus (COVID-19)?  To be considered HIGH RISK for Coronavirus (COVID-19), you have to meet the following criteria:  . Traveled to [place], [place], [place], [place] or [place]; or in the [place] to [place], [place], [place], or [place]; and have fever, cough, and shortness of breath within the last 2 weeks of travel OR . Been in close contact with a person diagnosed with COVID-19 within the last 2 weeks and have fever, cough, and shortness of breath . IF YOU DO NOT MEET THESE CRITERIA, YOU ARE CONSIDERED LOW RISK FOR COVID-19.  What to do if you are HIGH RISK for COVID-19?  . If you are having a medical emergency, call 911. . Seek medical care right away. Before you go to a doctor's office, urgent care or emergency department, call ahead and tell them about your recent travel, contact with someone diagnosed with COVID-19, and your symptoms. You should receive instructions from your physician's office regarding next steps of care.  . When you arrive at healthcare provider, tell the healthcare staff immediately you have returned from visiting [place], [place], [place], [place] or [place]; or traveled in the [place] to [place], [place], [place], or [place]; in the last two weeks or you have been in close contact with a person diagnosed with COVID-19 in the last 2 weeks.   . Tell the health care staff about your symptoms: fever, cough and shortness of breath. . After you have been seen by a medical provider, you will be either: o Tested for (COVID-19) and discharged home on quarantine except to seek medical care if symptoms worsen, and asked to  - Stay home and avoid contact with others until you get your results (4-5 days)  - Avoid travel on public transportation if possible (such as bus, train, or airplane) or o Sent to the Emergency Department by EMS for evaluation, COVID-19 testing, and possible  admission depending on your condition and test results.  What to do if you are LOW RISK for COVID-19?  Reduce your risk of any infection by using the same precautions used for avoiding the common cold or flu:  . Wash your hands often with soap and warm water for at least 20 seconds.  If soap and water are not readily available, use an alcohol-based hand sanitizer with at least 60% alcohol.  . If coughing or sneezing, cover your mouth and nose by coughing or sneezing into the elbow areas of your shirt or coat, into a tissue or into your sleeve (not your hands). . Avoid shaking hands with others and consider head nods or verbal greetings only. . Avoid touching your eyes, nose, or mouth with unwashed hands.  . Avoid close contact with people who are sick. . Avoid places or events with large numbers of people in one location, like concerts or sporting events. . Carefully consider travel plans you have or are making. . If you are planning any travel outside or inside the [place], visit the CDC's Travelers' Health webpage for the latest health notices. . If you have some symptoms but not all symptoms, continue to monitor at home and seek medical attention if your symptoms worsen. . If you are having a medical emergency, call 911.   ADDITIONAL HEALTHCARE OPTIONS FOR PATIENTS  [facility] / e-Visit: [email or web address]         [facility]: [phone]  [facility]: [phone]                   [facility]: [phone]   

## 2019-06-12 NOTE — Progress Notes (Signed)
Alexandra Haynes presents today for f/u with Dr. Sondra Come. Pt denies c/o pain. Pt denies dysuria/hematuria. Pt denies vaginal bleeding/discharge. Pt denies rectal bleeding. Pt reports occasional constipation, is relieved by Miralax. Pt reports occasional abdominal bloating. Pt reports occasional nausea without vomiting.   BP (!) 171/72 (BP Location: Right Arm, Patient Position: Sitting, Cuff Size: Normal)   Pulse 75   Temp 98.5 F (36.9 C)   Resp 20   Wt 171 lb 6.4 oz (77.7 kg)   SpO2 100%   BMI 28.52 kg/m   Wt Readings from Last 3 Encounters:  06/12/19 171 lb 6.4 oz (77.7 kg)  04/06/19 171 lb 8 oz (77.8 kg)  01/09/19 175 lb (79.4 kg)   [name], RN BSN

## 2019-10-10 ENCOUNTER — Inpatient Hospital Stay: Payer: Medicare Other | Attending: Gynecologic Oncology | Admitting: Gynecologic Oncology

## 2019-10-10 ENCOUNTER — Encounter: Payer: Self-pay | Admitting: Gynecologic Oncology

## 2019-10-10 ENCOUNTER — Other Ambulatory Visit: Payer: Self-pay

## 2019-10-10 VITALS — BP 144/70 | HR 81 | Temp 98.0°F | Resp 15 | Ht 65.0 in | Wt 170.4 lb

## 2019-10-10 DIAGNOSIS — Z803 Family history of malignant neoplasm of breast: Secondary | ICD-10-CM | POA: Insufficient documentation

## 2019-10-10 DIAGNOSIS — Z8542 Personal history of malignant neoplasm of other parts of uterus: Secondary | ICD-10-CM | POA: Insufficient documentation

## 2019-10-10 DIAGNOSIS — C541 Malignant neoplasm of endometrium: Secondary | ICD-10-CM

## 2019-10-10 DIAGNOSIS — Z853 Personal history of malignant neoplasm of breast: Secondary | ICD-10-CM | POA: Diagnosis not present

## 2019-10-10 DIAGNOSIS — Z780 Asymptomatic menopausal state: Secondary | ICD-10-CM | POA: Diagnosis not present

## 2019-10-10 DIAGNOSIS — Z923 Personal history of irradiation: Secondary | ICD-10-CM | POA: Diagnosis not present

## 2019-10-10 DIAGNOSIS — Z800 Family history of malignant neoplasm of digestive organs: Secondary | ICD-10-CM | POA: Diagnosis not present

## 2019-10-10 DIAGNOSIS — R140 Abdominal distension (gaseous): Secondary | ICD-10-CM | POA: Insufficient documentation

## 2019-10-10 DIAGNOSIS — Z809 Family history of malignant neoplasm, unspecified: Secondary | ICD-10-CM | POA: Diagnosis not present
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
## 2019-10-10 NOTE — Patient Instructions (Signed)
Please notify Dr Denman George at phone number [phone] if you notice vaginal bleeding, new pelvic or abdominal pains, bloating, feeling full easy, or a change in bladder or bowel function.   Please return to see Dr Denman George in December, 2021

## 2020-01-02 ENCOUNTER — Telehealth: Payer: Self-pay | Admitting: *Deleted

## 2020-01-02 NOTE — Telephone Encounter (Signed)
CALLED PATIENT TO ALTER FU FOR 01-11-20 RESCHEDULED FOR 01-11-20 @ 8:15 AM, SPOKE WITH PATIENT AND SHE IS GOOD WITH THIS APPT.

## 2020-01-10 NOTE — Progress Notes (Signed)
Radiation Oncology         [phone]) [phone] ________________________________  Name: Alexandra Haynes MRN: [id number]  Date: 01/11/2020  DOB: 1946/02/13  Follow-Up Visit Note  CC: Patient, No Pcp Per  Alexandra Amber, MD    ICD-10-CM   1. Endometrial cancer ([place])  C54.1 CT Abdomen Pelvis W Contrast    Diagnosis:  Stage IA,pT1a, pN0 high grade endometrioid adenocarcinoma   Interval Since Last Radiation: One year, five months, and one week.  3/10, 3/19, 3/26, 4/2, 08/04/2018: Vaginal Cuff (HDR brachytherapy; 3 cm cylinder, 3.5 cm length) / 30 Gy in 5 fractions  Narrative: The patient returns today for routine follow-up. Since her last visit, she followed-up with Dr. Denman Haynes on 10/10/2019. At that time, she was having some mild spotting and cramping but no other pelvic symptoms. The right upper vaginal wall nodule was stable on examination.  On review of systems, the patient reports severe pain with bowel movements.  She reports is lasting for several hours after a bowel movement.  This issue has worsened over the past couple of months.  She does have a history of irritable bowel syndrome but this has been quiescent over the past several years and she is not on medications for this issue.  she denies vaginal bleeding.   ALLERGIES:  is allergic to latex, sulfa antibiotics, and phenazopyridine.  Meds: Current Outpatient Medications  Medication Sig Dispense Refill  . Calcium Carbonate-Vitamin D 600-200 MG-UNIT TABS Take by mouth.    . DULoxetine (CYMBALTA) 60 MG capsule Take 60 mg by mouth 2 (two) times daily.     . famotidine (PEPCID) 20 MG tablet Take by mouth.    . hydrochlorothiazide (HYDRODIURIL) 12.5 MG tablet Take 12.5 mg by mouth daily.    Marland Kitchen losartan (COZAAR) 50 MG tablet Take 50 mg by mouth daily.    . Multiple Vitamin (MULTIVITAMIN WITH MINERALS) TABS tablet Take 1 tablet by mouth daily.    . Multiple Vitamins-Minerals (THERA-M) TABS Take by mouth.    . naproxen sodium (ALEVE) 220 MG  tablet Take 220-440 mg by mouth daily as needed (pain).    Sallye Lat (EYE DROPS ADVANCED RELIEF OP) Place 1 drop into both eyes daily as needed (dry eyes).     No current facility-administered medications for this encounter.    Physical Findings: The patient is in no acute distress. Patient is alert and oriented.  height is 5\' 5"  (1.651 m) and weight is 168 lb (76.2 kg). Her oral temperature is 98.4 F (36.9 C). Her blood pressure is 184/85 (abnormal) and her pulse is 78. Her respiration is 18 and oxygen saturation is 99%. .  No significant changes. Lungs are clear to auscultation bilaterally. Heart has regular rate and rhythm. No palpable cervical, supraclavicular, or axillary adenopathy. Abdomen soft, non-tender, normal bowel sounds. On pelvic examination the external genitalia were unremarkable. A speculum exam was performed. Benign lesion noted on the right lateral upper vaginal wall, which appears stable in size. No mucosal lesions noted in the vaginal vault. On bimanual examination, no pelvic masses were appreciated. Vaginal cuff intact.  Rectal exam reveals normal sphincter tone without any endorectal mass.  Lab Findings: Lab Results  Component Value Date   WBC 6.0 09/30/2018   HGB 11.4 (L) 09/30/2018   HCT 35.5 (L) 09/30/2018   MCV 88.5 09/30/2018   PLT 223 09/30/2018    Radiographic Findings: No results found.  Impression: Stage IA,pT1a, pN0 high grade endometrioid adenocarcinoma   No evidence of recurrence  on clinical exam today.  Clinical symptoms however are worrisome for possible recurrence.  Patient will be set up for a CT scan of the abdomen and pelvis for further evaluation  Plan: The patient is scheduled to follow-up with Dr. Denman Haynes on 04/25/2020. She will follow up with radiation oncology in six months assuming CT scans are okay.  If scans do not reveal any significant findings she would likely need to return to gastroenterology for evaluation of  her IBS symptoms.  Total time spent in this encounter was 25 minutes which included reviewing the patient's most recent follow-up with Dr. Denman Haynes, physical examination, and documentation and ordering of new scans. ____________________________________   Blair Promise, PhD, MD  This document serves as a record of services personally performed by Gery Pray, MD. It was created on his behalf by Clerance Lav, a trained medical scribe. The creation of this record is based on the scribe's personal observations and the provider's statements to them. This document has been checked and approved by the attending provider.

## 2020-01-11 ENCOUNTER — Other Ambulatory Visit: Payer: Self-pay

## 2020-01-11 ENCOUNTER — Ambulatory Visit
Admission: RE | Admit: 2020-01-11 | Discharge: 2020-01-11 | Disposition: A | Discharge status: Home / Self Care | Payer: Medicare Other | Source: Ambulatory Visit | Attending: Radiation Oncology | Admitting: Radiation Oncology

## 2020-01-11 DIAGNOSIS — Z8572 Personal history of non-Hodgkin lymphomas: Secondary | ICD-10-CM | POA: Insufficient documentation

## 2020-01-11 DIAGNOSIS — Z79899 Other long term (current) drug therapy: Secondary | ICD-10-CM | POA: Diagnosis not present

## 2020-01-11 DIAGNOSIS — K589 Irritable bowel syndrome without diarrhea: Secondary | ICD-10-CM | POA: Diagnosis not present

## 2020-01-11 DIAGNOSIS — Z923 Personal history of irradiation: Secondary | ICD-10-CM | POA: Diagnosis not present

## 2020-01-11 DIAGNOSIS — C541 Malignant neoplasm of endometrium: Secondary | ICD-10-CM

## 2020-01-11 NOTE — Progress Notes (Signed)
Patient here for a f/u visit with Dr. Sondra Come. She last saw Dr. Denman George in June, see note. Reports severe pain with bowel movements on occasion that stays all day. Denies vaginal bleeding. Reports a hx of GI issues.  BP (!) 184/85 (BP Location: Right Arm, Patient Position: Sitting)   Pulse 78   Temp 98.4 F (36.9 C) (Oral)   Resp 18   Ht 5\' 5"  (1.651 m)   Wt 168 lb (76.2 kg)   SpO2 99%   BMI 27.96 kg/m   Wt Readings from Last 3 Encounters:  01/11/20 168 lb (76.2 kg)  10/10/19 170 lb 6.4 oz (77.3 kg)  06/12/19 171 lb 6.4 oz (77.7 kg)

## 2020-01-15 ENCOUNTER — Telehealth: Payer: Self-pay | Admitting: *Deleted

## 2020-01-15 NOTE — Telephone Encounter (Signed)
CALLED PATIENT TO INFORM OF FU APPT. WITH DR. [place] ON 07-08-20 @ 10:30 AM, LVM FOR A RETURN CALL

## 2020-01-18 ENCOUNTER — Other Ambulatory Visit: Payer: Self-pay

## 2020-01-18 ENCOUNTER — Ambulatory Visit (HOSPITAL_COMMUNITY)
Admission: RE | Admit: 2020-01-18 | Discharge: 2020-01-18 | Disposition: A | Discharge status: Home / Self Care | Payer: Medicare Other | Source: Ambulatory Visit | Attending: Radiation Oncology | Admitting: Radiation Oncology

## 2020-01-18 DIAGNOSIS — C541 Malignant neoplasm of endometrium: Secondary | ICD-10-CM | POA: Insufficient documentation

## 2020-01-18 LAB — POCT I-STAT CREATININE: Creatinine, Ser: 0.8 mg/dL (ref 0.44–1.00)

## 2020-01-18 MED ORDER — IOHEXOL 300 MG/ML  SOLN
100.0000 mL | Freq: Once | INTRAMUSCULAR | Status: AC | PRN
  Administered 2020-01-18: 100 mL via INTRAVENOUS
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
## 2020-04-25 ENCOUNTER — Encounter: Payer: Self-pay | Admitting: Gynecologic Oncology

## 2020-04-25 ENCOUNTER — Inpatient Hospital Stay: Payer: Medicare Other | Attending: Gynecologic Oncology | Admitting: Gynecologic Oncology

## 2020-04-25 ENCOUNTER — Other Ambulatory Visit: Payer: Self-pay

## 2020-04-25 VITALS — BP 165/74 | HR 76 | Temp 97.0°F | Resp 18 | Ht 65.0 in | Wt 168.2 lb

## 2020-04-25 DIAGNOSIS — Z90722 Acquired absence of ovaries, bilateral: Secondary | ICD-10-CM | POA: Insufficient documentation

## 2020-04-25 DIAGNOSIS — Z90710 Acquired absence of both cervix and uterus: Secondary | ICD-10-CM | POA: Diagnosis not present

## 2020-04-25 DIAGNOSIS — Z8542 Personal history of malignant neoplasm of other parts of uterus: Secondary | ICD-10-CM

## 2020-04-25 DIAGNOSIS — Z08 Encounter for follow-up examination after completed treatment for malignant neoplasm: Secondary | ICD-10-CM | POA: Insufficient documentation

## 2020-04-25 DIAGNOSIS — Z923 Personal history of irradiation: Secondary | ICD-10-CM | POA: Insufficient documentation

## 2020-04-25 DIAGNOSIS — C541 Malignant neoplasm of endometrium: Secondary | ICD-10-CM

## 2020-04-25 NOTE — Patient Instructions (Signed)
Please notify Dr Andrey Farmer at phone number [phone] if you notice vaginal bleeding, new pelvic or abdominal pains, bloating, feeling full easy, or a change in bladder or bowel function.   Please return to see Dr Andrey Farmer in 6 months and Dr Roselind Messier in 3 months (March 14th).

## 2020-07-04 ENCOUNTER — Encounter: Payer: Self-pay | Admitting: *Deleted

## 2020-07-08 ENCOUNTER — Ambulatory Visit: Payer: Self-pay | Admitting: Radiation Oncology

## 2020-07-29 ENCOUNTER — Telehealth: Payer: Self-pay | Admitting: *Deleted

## 2020-07-29 ENCOUNTER — Ambulatory Visit
Admission: RE | Admit: 2020-07-29 | Discharge: 2020-07-29 | Disposition: A | Discharge status: Home / Self Care | Payer: Medicare Other | Source: Ambulatory Visit | Attending: Radiation Oncology | Admitting: Radiation Oncology

## 2020-07-29 ENCOUNTER — Encounter: Payer: Self-pay | Admitting: Radiation Oncology

## 2020-07-29 ENCOUNTER — Other Ambulatory Visit: Payer: Self-pay

## 2020-07-29 VITALS — BP 181/72 | HR 74 | Temp 97.8°F | Resp 20 | Ht 65.0 in | Wt 164.8 lb

## 2020-07-29 DIAGNOSIS — R3915 Urgency of urination: Secondary | ICD-10-CM | POA: Insufficient documentation

## 2020-07-29 DIAGNOSIS — C541 Malignant neoplasm of endometrium: Secondary | ICD-10-CM

## 2020-07-29 DIAGNOSIS — Z8542 Personal history of malignant neoplasm of other parts of uterus: Secondary | ICD-10-CM | POA: Diagnosis not present

## 2020-07-29 DIAGNOSIS — Z842 Family history of other diseases of the genitourinary system: Secondary | ICD-10-CM | POA: Insufficient documentation

## 2020-07-29 DIAGNOSIS — Z79899 Other long term (current) drug therapy: Secondary | ICD-10-CM | POA: Diagnosis not present

## 2020-07-29 DIAGNOSIS — R300 Dysuria: Secondary | ICD-10-CM | POA: Diagnosis not present

## 2020-07-29 DIAGNOSIS — Z923 Personal history of irradiation: Secondary | ICD-10-CM | POA: Insufficient documentation

## 2020-07-29 NOTE — Progress Notes (Signed)
Radiation Oncology         [phone] ________________________________  Name: SOLAE Haynes MRN: [id number]  Date: 07/29/2020  DOB: 02-13-1946  Follow-Up Visit Note  CC: Patient, No Pcp Per (Inactive)  Alexandra Amber, MD    ICD-10-CM   1. Endometrial cancer ([place])  C54.1     Diagnosis:  Stage IA,pT1a, pN0 high grade endometrioid adenocarcinoma   Interval Since Last Radiation: One year, eleven months, three weeks, and five days  3/10, 3/19, 3/26, 4/2, 08/04/2018: Vaginal Cuff (HDR brachytherapy; 3 cm cylinder, 3.5 cm length) / 30 Gy in 5 fractions  Narrative: The patient returns today for routine follow-up. Since her last visit, she underwent a CT scan of abdomen and pelvis on 01/18/2020 for evaluation of abdominal pain with bowel movements. Results showed no acute findings in the abdomen or pelvis. There was no definite evidence to suggest metastatic disease in the abdomen or pelvis.  She followed-up with Dr. Denman George on 04/15/2020, at which time there was no evidence of disease recurrence.  On review of systems, she reports intermittent dysuria, episodes of urinary urgency, and not being able to completely empty bladder. She denies pain, abdominal bloating, diarrhea, constipation, nausea, vomiting, vaginal discharge, hematochezia, hematuria, and skin changes. She is not using her vaginal dilator and does not wish to resume.  She continues to have occasional discomfort in the right lower quadrant which she now relates to her scar tissue from her appendicitis many years ago.  ALLERGIES:  is allergic to latex, sulfa antibiotics, and phenazopyridine.  Meds: Current Outpatient Medications  Medication Sig Dispense Refill  . Calcium Carbonate-Vitamin D 600-200 MG-UNIT TABS Take by mouth.    . DULoxetine (CYMBALTA) 60 MG capsule Take 60 mg by mouth 2 (two) times daily.     . hydrochlorothiazide (HYDRODIURIL) 12.5 MG tablet Take 12.5 mg by mouth daily.    Marland Kitchen losartan (COZAAR) 50 MG tablet  Take 50 mg by mouth daily.    . Multiple Vitamin (MULTIVITAMIN WITH MINERALS) TABS tablet Take 1 tablet by mouth daily.    . naproxen sodium (ALEVE) 220 MG tablet Take 220-440 mg by mouth daily as needed (pain).    Sallye Lat (EYE DROPS ADVANCED RELIEF OP) Place 1 drop into both eyes daily as needed (dry eyes).    . famotidine (PEPCID) 20 MG tablet Take by mouth.    . Multiple Vitamins-Minerals (THERA-M) TABS Take by mouth. (Patient not taking: Reported on 07/29/2020)     No current facility-administered medications for this encounter.    Physical Findings: The patient is in no acute distress. Patient is alert and oriented.  height is 5\' 5"  (1.651 m) and weight is 164 lb 12.8 oz (74.8 kg). Her temperature is 97.8 F (36.6 C). Her blood pressure is 181/72 (abnormal) and her pulse is 74. Her respiration is 20 and oxygen saturation is 100%. .  Lungs are clear to auscultation bilaterally. Heart has regular rate and rhythm. No palpable cervical, supraclavicular, or axillary adenopathy. Abdomen soft, non-tender, normal bowel sounds. On pelvic examination the external genitalia were unremarkable. A speculum exam was performed. There are no mucosal lesions noted in the vaginal vault. On bimanual  examination there were no pelvic masses appreciated.  She continues have the benign lesion along the right mid vaginal wall.  This was biopsied at the time of her surgery and returned squamous lined mucosa.  Lab Findings: Lab Results  Component Value Date   WBC 6.0 09/30/2018   HGB 11.4 (L) 09/30/2018  HCT 35.5 (L) 09/30/2018   MCV 88.5 09/30/2018   PLT 223 09/30/2018    Radiographic Findings: No results found.  Impression: Stage IA,pT1a, pN0 high grade endometrioid adenocarcinoma   No evidence of recurrence on clinical exam today.    Plan: The patient is scheduled to follow-up with Dr. Denman George on 10/14/2020. She will follow up with radiation oncology in nine months since she will  be 2 years out from her treatment.  Total time spent in this encounter was 20 minutes which included reviewing the patient's most recent CT scan of abdomen and pelvis, follow-up with Dr. Denman George, physical examination, and documentation.  ____________________________________   Blair Promise, PhD, MD  This document serves as a record of services personally performed by Gery Pray, MD. It was created on his behalf by Clerance Lav, a trained medical scribe. The creation of this record is based on the scribe's personal observations and the provider's statements to them. This document has been checked and approved by the attending provider.

## 2020-07-29 NOTE — Progress Notes (Signed)
Alexandra Haynes is here today for follow up post radiation to the pelvic.  They completed their radiation on: 08/04/18  Does the patient complain of any of the following:  . Pain:No complaints of pain . Abdominal bloating: no  . Diarrhea/Constipation: Patient denies any bowel issues at this time. Pt has history of IBS . Nausea/Vomiting: no . Vaginal Discharge: no . Blood in Urine or Stool: no . Urinary Issues (dysuria/incomplete emptying/ incontinence/ increased frequency/urgency): Patient has intermittent dysuria, Patient reports not completely emptying bladder and does have episodes of urgency.  . Does patient report using vaginal dilator 2-3 times a week and/or sexually active 2-3 weeks:  Patient reports not using dilator.  Marland Kitchen Post radiation skin changes: Patient denies any skin changes.    Additional comments if applicable:  Vitals:   39/58/44 1553  BP: (!) 181/72  Pulse: 74  Resp: 20  Temp: 97.8 F (36.6 C)  SpO2: 100%  Weight: 164 lb 12.8 oz (74.8 kg)  Height: 5\' 5"  (1.651 m)

## 2020-07-29 NOTE — Telephone Encounter (Signed)
RETURNED PATIENT'S PHONE CALL, SPOKE WITH PATIENT. ?

## 2020-07-30 ENCOUNTER — Telehealth: Payer: Self-pay | Admitting: *Deleted

## 2020-07-30 NOTE — Telephone Encounter (Signed)
CALLED PATIENT TO INFORM OF FU APPT. WITH DR. KINARD ON 05-05-21 @ 3 PM, LVM FOR A RETURN CALL

## 2020-10-14 ENCOUNTER — Inpatient Hospital Stay: Payer: Medicare Other | Attending: Gynecologic Oncology | Admitting: Gynecologic Oncology

## 2020-10-14 ENCOUNTER — Other Ambulatory Visit: Payer: Self-pay

## 2020-10-14 VITALS — BP 159/77 | HR 78 | Temp 97.3°F | Resp 18 | Ht 65.0 in | Wt 157.2 lb

## 2020-10-14 DIAGNOSIS — M5136 Other intervertebral disc degeneration, lumbar region: Secondary | ICD-10-CM | POA: Diagnosis not present

## 2020-10-14 DIAGNOSIS — I10 Essential (primary) hypertension: Secondary | ICD-10-CM | POA: Diagnosis not present

## 2020-10-14 DIAGNOSIS — Z923 Personal history of irradiation: Secondary | ICD-10-CM | POA: Diagnosis not present

## 2020-10-14 DIAGNOSIS — F419 Anxiety disorder, unspecified: Secondary | ICD-10-CM | POA: Insufficient documentation

## 2020-10-14 DIAGNOSIS — Z90722 Acquired absence of ovaries, bilateral: Secondary | ICD-10-CM | POA: Insufficient documentation

## 2020-10-14 DIAGNOSIS — Z90710 Acquired absence of both cervix and uterus: Secondary | ICD-10-CM | POA: Insufficient documentation

## 2020-10-14 DIAGNOSIS — M353 Polymyalgia rheumatica: Secondary | ICD-10-CM | POA: Insufficient documentation

## 2020-10-14 DIAGNOSIS — K219 Gastro-esophageal reflux disease without esophagitis: Secondary | ICD-10-CM | POA: Diagnosis not present

## 2020-10-14 DIAGNOSIS — F32A Depression, unspecified: Secondary | ICD-10-CM | POA: Insufficient documentation

## 2020-10-14 DIAGNOSIS — Z79899 Other long term (current) drug therapy: Secondary | ICD-10-CM | POA: Insufficient documentation

## 2020-10-14 DIAGNOSIS — Z8543 Personal history of malignant neoplasm of ovary: Secondary | ICD-10-CM | POA: Diagnosis present

## 2020-10-14 DIAGNOSIS — H8109 Meniere's disease, unspecified ear: Secondary | ICD-10-CM | POA: Insufficient documentation

## 2020-10-14 DIAGNOSIS — C541 Malignant neoplasm of endometrium: Secondary | ICD-10-CM

## 2020-10-14 NOTE — Patient Instructions (Signed)
Please notify Dr Denman George at phone number [phone] if you notice vaginal bleeding, new pelvic or abdominal pains, bloating, feeling full easy, or a change in bladder or bowel function.   Dr Denman George is departing the [place] at [facility] in October, 2022. Her partners and colleagues including Dr Berline Lopes, Dr Delsa Sale and Joylene John, Nurse Practitioner will be available to continue your care.   You are next scheduled to return to the Gynecologic Oncology office at the [facility] in June of 2023. Please call [phone] in or after your visit with Dr Sondra Come in [place] to request an appointment for Dr Berline Lopes in June, 2023.
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
## 2021-05-04 NOTE — Progress Notes (Incomplete)
Radiation Oncology         [phone] ________________________________  Name: Alexandra Haynes MRN: [id number]  Date: 05/05/2021  DOB: 1945-10-20  Follow-Up Visit Note  CC: Patient, No Pcp Per (Inactive)  Everitt Amber, MD  No diagnosis found.  Diagnosis: Stage IA, pT1a, pN0 high grade endometrioid adenocarcinoma   Interval Since Last Radiation: 2 years and 9 months  3/10, 3/19, 3/26, 4/2, 08/04/2018: Vaginal Cuff (HDR brachytherapy; 3 cm cylinder, 3.5 cm length) / 30 Gy in 5 fractions  Narrative:  The patient returns today for 9 month follow-up, she was last see here for follow up on 07/29/20.     Since then, the patient followed up with Dr. Denman George on 10/14/20. During which time, pelvic exam performed revealed a stable benign right upper vaginal wall skin tag. The patient was otherwise noted as NED on examination and reported no symptoms concerning for disease recurrence.          Otherwise, no significant interval history since the patient was last seen for follow up.                 Allergies:  is allergic to latex, sulfa antibiotics, and phenazopyridine.  Meds: Current Outpatient Medications  Medication Sig Dispense Refill   Calcium Carbonate-Vitamin D 600-200 MG-UNIT TABS Take by mouth.     DULoxetine (CYMBALTA) 60 MG capsule Take 60 mg by mouth 2 (two) times daily.      famotidine (PEPCID) 20 MG tablet Take by mouth.     hydrochlorothiazide (HYDRODIURIL) 12.5 MG tablet Take 12.5 mg by mouth daily.     losartan (COZAAR) 50 MG tablet Take 50 mg by mouth daily.     Multiple Vitamin (MULTIVITAMIN WITH MINERALS) TABS tablet Take 1 tablet by mouth daily.     Multiple Vitamins-Minerals (THERA-M) TABS Take by mouth. (Patient not taking: Reported on 07/29/2020)     naproxen sodium (ALEVE) 220 MG tablet Take 220-440 mg by mouth daily as needed (pain).     Tetrahydroz-Dextran-PEG-Povid (EYE DROPS ADVANCED RELIEF OP) Place 1 drop into both eyes daily as needed (dry eyes).     No  current facility-administered medications for this encounter.    Physical Findings: The patient is in no acute distress. Patient is alert and oriented.  vitals were not taken for this visit. .  No significant changes. Lungs are clear to auscultation bilaterally. Heart has regular rate and rhythm. No palpable cervical, supraclavicular, or axillary adenopathy. Abdomen soft, non-tender, normal bowel sounds.  On pelvic examination the external genitalia were unremarkable. A speculum exam was performed. There are no mucosal lesions noted in the vaginal vault. A Pap smear was obtained of the proximal vagina. On bimanual and rectovaginal examination there were no pelvic masses appreciated. ***    Lab Findings: Lab Results  Component Value Date   WBC 6.0 09/30/2018   HGB 11.4 (L) 09/30/2018   HCT 35.5 (L) 09/30/2018   MCV 88.5 09/30/2018   PLT 223 09/30/2018    Radiographic Findings: No results found.  Impression: Stage IA, pT1a, pN0 high grade endometrioid adenocarcinoma   ***  Plan:  ***   *** minutes of total time was spent for this patient encounter, including preparation, face-to-face counseling with the patient and coordination of care, physical exam, and documentation of the encounter. ____________________________________  Blair Promise, PhD, MD  This document serves as a record of services personally performed by Gery Pray, MD. It was created on his behalf by Roney Mans, a  trained medical scribe. The creation of this record is based on the scribe's personal observations and the provider's statements to them. This document has been checked and approved by the attending provider.

## 2021-05-05 ENCOUNTER — Ambulatory Visit
Admission: RE | Admit: 2021-05-05 | Discharge: 2021-05-05 | Disposition: A | Discharge status: Home / Self Care | Payer: Medicare Other | Source: Ambulatory Visit | Attending: Radiation Oncology | Admitting: Radiation Oncology

## 2021-06-01 NOTE — Progress Notes (Signed)
Radiation Oncology         (336) [phone] ________________________________  Name: Alexandra Haynes MRN: [id number]  Date: 06/02/2021  DOB: August 24, 1945  Follow-Up Visit Note  CC: Patient, No Pcp Per (Inactive)  Everitt Amber, MD    ICD-10-CM   1. Endometrial cancer ([place])  C54.1       Diagnosis: Stage IA, pT1a, pN0 high grade endometrioid adenocarcinoma   Interval Since Last Radiation: 2 years, 9 months, and 28 days   3/10, 3/19, 3/26, 4/2, 08/04/2018: Vaginal Cuff (HDR brachytherapy; 3 cm cylinder, 3.5 cm length) / 30 Gy in 5 fractions  Narrative:  The patient returns today for 9 month follow-up, she was last see here for follow up on 07/29/20.     Since then, the patient followed up with Dr. Denman George on 10/14/20. During which time, pelvic exam performed revealed a stable benign right upper vaginal wall skin tag. The patient was otherwise noted as NED on examination and reported no symptoms concerning for disease recurrence.          Otherwise, no significant interval history since the patient was last seen for follow up.      She denies any pelvic pain or vaginal bleeding.  She has stopped using the vaginal dilator since it has been almost 3 years since her treatments.  She does have some abdominal bloating which is relieved with medication.  She denies any hematuria or rectal bleeding.             Allergies:  is allergic to latex, sulfa antibiotics, and phenazopyridine.  Meds: Current Outpatient Medications  Medication Sig Dispense Refill   Calcium Carbonate-Vitamin D 600-200 MG-UNIT TABS Take by mouth.     DULoxetine (CYMBALTA) 60 MG capsule Take 60 mg by mouth 2 (two) times daily.      hydrochlorothiazide (HYDRODIURIL) 12.5 MG tablet Take 12.5 mg by mouth daily.     hydrOXYzine (ATARAX) 10 MG tablet Take by mouth.     losartan (COZAAR) 50 MG tablet Take 50 mg by mouth daily.     Multiple Vitamin (MULTIVITAMIN WITH MINERALS) TABS tablet Take 1 tablet by mouth daily.     naproxen  sodium (ALEVE) 220 MG tablet Take 220-440 mg by mouth daily as needed (pain).     Probiotic Product (CULTURELLE PROBIOTICS PO) Take 1 capsule by mouth daily.     famotidine (PEPCID) 20 MG tablet Take by mouth.     Tetrahydroz-Dextran-PEG-Povid (EYE DROPS ADVANCED RELIEF OP) Place 1 drop into both eyes daily as needed (dry eyes). (Patient not taking: Reported on 06/02/2021)     No current facility-administered medications for this encounter.    Physical Findings: The patient is in no acute distress. Patient is alert and oriented.  height is 5\' 5"  (1.651 m) and weight is 165 lb 6.4 oz (75 kg). Her temperature is 97.6 F (36.4 C). Her blood pressure is 184/77 (abnormal) and her pulse is 73. Her respiration is 20 and oxygen saturation is 100%. .   Lungs are clear to auscultation bilaterally. Heart has regular rate and rhythm. No palpable cervical, supraclavicular, or axillary adenopathy. Abdomen soft, non-tender, normal bowel sounds.  On pelvic examination the external genitalia were unremarkable. A speculum exam was performed. There are no mucosal lesions noted in the vaginal vault.  Prominent mucosal tag noted along the right vaginal wall which has been noted on previous exams  On bimanual and rectovaginal examination there were no pelvic masses appreciated.  Vaginal cuff intact.  Rectal  sphincter tone good    Lab Findings: Lab Results  Component Value Date   WBC 6.0 09/30/2018   HGB 11.4 (L) 09/30/2018   HCT 35.5 (L) 09/30/2018   MCV 88.5 09/30/2018   PLT 223 09/30/2018    Radiographic Findings: No results found.  Impression: Stage IA, pT1a, pN0 high grade endometrioid adenocarcinoma   No evidence recurrence on clinical exam today.  Patient does not appear to be exhibiting any long-term effects from her surgery and vaginal brachytherapy.  Plan: Routine follow-up in 1 year.  The patient will see Dr. Berline Lopes in approximately 6 months.   23 minutes of total time was spent for this  patient encounter, including preparation, face-to-face counseling with the patient and coordination of care, physical exam, and documentation of the encounter. ____________________________________  Blair Promise, PhD, MD  This document serves as a record of services personally performed by Gery Pray, MD. It was created on his behalf by Roney Mans, a trained medical scribe. The creation of this record is based on the scribe's personal observations and the provider's statements to them. This document has been checked and approved by the attending provider.

## 2021-06-02 ENCOUNTER — Other Ambulatory Visit: Payer: Self-pay

## 2021-06-02 ENCOUNTER — Ambulatory Visit
Admission: RE | Admit: 2021-06-02 | Discharge: 2021-06-02 | Disposition: A | Discharge status: Home / Self Care | Payer: Medicare Other | Source: Ambulatory Visit | Attending: Radiation Oncology | Admitting: Radiation Oncology

## 2021-06-02 ENCOUNTER — Encounter: Payer: Self-pay | Admitting: Radiation Oncology

## 2021-06-02 DIAGNOSIS — C541 Malignant neoplasm of endometrium: Secondary | ICD-10-CM

## 2021-06-02 DIAGNOSIS — Z923 Personal history of irradiation: Secondary | ICD-10-CM | POA: Diagnosis not present

## 2021-06-02 DIAGNOSIS — Z79899 Other long term (current) drug therapy: Secondary | ICD-10-CM | POA: Insufficient documentation

## 2021-06-02 DIAGNOSIS — R140 Abdominal distension (gaseous): Secondary | ICD-10-CM | POA: Diagnosis not present

## 2021-06-02 DIAGNOSIS — Z8542 Personal history of malignant neoplasm of other parts of uterus: Secondary | ICD-10-CM | POA: Diagnosis not present

## 2021-06-02 DIAGNOSIS — R109 Unspecified abdominal pain: Secondary | ICD-10-CM | POA: Diagnosis not present

## 2021-06-02 DIAGNOSIS — F419 Anxiety disorder, unspecified: Secondary | ICD-10-CM | POA: Insufficient documentation

## 2021-06-02 NOTE — Progress Notes (Signed)
Alexandra Haynes is here today for follow up post radiation to the pelvis.  They completed their radiation on: 08/04/2018   Does the patient complain of any of the following:  Pain:denies Abdominal bloating: yes, takes culturelle po which relieves bloating Diarrhea/Constipation: constipation Nausea/Vomiting: denies Vaginal Discharge: denies Blood in Urine or Stool: denies Urinary Issues (dysuria/incomplete emptying/ incontinence/ increased frequency/urgency): frequency, urgency, nocturia x2-3 Does patient report using vaginal dilator 2-3 times a week and/or sexually active 2-3 weeks: no Post radiation skin changes: n/a   Additional comments if applicable: anxiety, "I get stressed easy"  Vitals:   06/02/21 1541  BP: (!) 184/77  Pulse: 73  Resp: 20  Temp: 97.6 F (36.4 C)  SpO2: 100%  Weight: 165 lb 6.4 oz (75 kg)  Height: 5\' 5"  (1.651 m)

## 2021-06-03 ENCOUNTER — Telehealth: Payer: Self-pay | Admitting: *Deleted

## 2021-06-03 NOTE — Telephone Encounter (Signed)
CALLED PATIENT TO INFORM OF FU WITH DR. KINARD ON 06-01-22 @ 11 AM, LVM FOR A RETURN CALL

## 2021-09-24 ENCOUNTER — Telehealth: Payer: Self-pay

## 2021-09-24 ENCOUNTER — Telehealth: Payer: Self-pay | Admitting: *Deleted

## 2021-09-24 NOTE — Telephone Encounter (Signed)
CALLED PATIENT TO INFORM OF FU APPT. WITH DR. Berline Haynes ON 11-28-21- ARRIVAL TIME- 2:15 PM, LVM FOR A RETURN CALL

## 2021-09-24 NOTE — Telephone Encounter (Signed)
Enid Derry from (RAD ONC) called office.  Pt is scheduled for a follow up on 11/28/21  at 2:45p with Dr.Tucker.  Enid Derry to notify pt od appointment date and time.

## 2021-11-27 ENCOUNTER — Encounter: Payer: Self-pay | Admitting: Gynecologic Oncology

## 2021-11-28 ENCOUNTER — Inpatient Hospital Stay: Payer: Medicare Other | Attending: Gynecologic Oncology | Admitting: Gynecologic Oncology

## 2021-11-28 ENCOUNTER — Other Ambulatory Visit: Payer: Self-pay

## 2021-11-28 VITALS — BP 171/71 | HR 73 | Wt 161.5 lb

## 2021-11-28 DIAGNOSIS — C541 Malignant neoplasm of endometrium: Secondary | ICD-10-CM

## 2021-11-28 DIAGNOSIS — Z90722 Acquired absence of ovaries, bilateral: Secondary | ICD-10-CM

## 2021-11-28 DIAGNOSIS — Z8542 Personal history of malignant neoplasm of other parts of uterus: Secondary | ICD-10-CM

## 2021-11-28 DIAGNOSIS — Z923 Personal history of irradiation: Secondary | ICD-10-CM

## 2021-11-28 DIAGNOSIS — Z9079 Acquired absence of other genital organ(s): Secondary | ICD-10-CM

## 2021-11-28 DIAGNOSIS — Z90710 Acquired absence of both cervix and uterus: Secondary | ICD-10-CM
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
## 2021-11-28 NOTE — Patient Instructions (Signed)
It was nice to meet you today.  I do not see or feel any evidence of cancer recurrence on your exam.  We will continue with visits every 6 months alternating between our office and radiation oncology.  You are scheduled to see Dr. Sondra Come in February.  Please call back in June or July to schedule a visit to see me in August 2024.  As always, if you develop new and concerning symptoms between visits, please call to see me sooner.

## 2022-05-08 DIAGNOSIS — H3563 Retinal hemorrhage, bilateral: Secondary | ICD-10-CM | POA: Diagnosis not present

## 2022-05-08 DIAGNOSIS — H35033 Hypertensive retinopathy, bilateral: Secondary | ICD-10-CM | POA: Diagnosis not present

## 2022-05-08 DIAGNOSIS — H401131 Primary open-angle glaucoma, bilateral, mild stage: Secondary | ICD-10-CM | POA: Diagnosis not present

## 2022-05-08 DIAGNOSIS — G43B0 Ophthalmoplegic migraine, not intractable: Secondary | ICD-10-CM | POA: Diagnosis not present

## 2022-05-25 NOTE — Progress Notes (Signed)
Christel Mormon is here today for follow up post radiation to the pelvic.  They completed their radiation on: completed their radiation on: 08/04/2018     Does the patient complain of any of the following:  Pain:None Abdominal bloating: Sometime Diarrhea/Constipation: None Nausea/Vomiting: none Vaginal Discharge: None Blood in Urine or Stool: None Urinary Issues (dysuria/incomplete emptying/ incontinence/ increased frequency/urgency): Frequency with urination Does patient report using vaginal dilator 2-3 times a week and/or sexually active 2-3 weeks: States that she does not use the dilator.  Post radiation skin changes: No issues   Additional comments if applicable:  Vitals:   02/27/14 1051  BP: (!) 142/60  Pulse: 75  Resp: 20  Temp: 97.6 F (36.4 C)  SpO2: 100%  Weight: 76.5 kg  Height: '5\' 4"'$  (1.626 m)

## 2022-05-31 NOTE — Progress Notes (Signed)
Radiation Oncology         [phone] ________________________________  Name: Alexandra Haynes MRN: [id number]  Date: 06/01/2022  DOB: [birth year]  Follow-Up Visit Note  CC: Patient, No Pcp Per  Everitt Amber, MD  No diagnosis found.  Diagnosis: Stage IA, pT1a, pN0 high grade endometrioid adenocarcinoma   Interval Since Last Radiation: 3 years, 9 months, and 27 days  3/10, 3/19, 3/26, 4/2, 08/04/2018: Vaginal Cuff (HDR brachytherapy; 3 cm cylinder, 3.5 cm length) / 30 Gy in 5 fractions   Narrative:  The patient returns today for routine annual follow-up. She was last seen here for follow-up on 06/02/21. Since her last visit, the patient followed up with Dr. Berline Lopes on 11/28/21. During which time, the patient reported occasional abdominal pain, at baseline, which she attributed to her GI tract. She also endorsed constipation (recently started Culturelle with improvement), and stable urinary frequency and urgency which both started last year. She otherwise denied any symptoms concerning for disease recurrence and she was noted to be NED on examination.   Of note: The patient had several ED encounters in the interval related to vertigo (04/06/22) and left hand, leg, and foot numbness x 2-3 days (04/15/22). She presented to the [facility] ED for both of her encounters, however encounter details are not available at this time. We do have access to imaging studies performed during these encounters, which are detailed as follows:  -- CT of the head performed in the ED on 04/06/22 for evaluation of vertigo and altered mental status showed no acute intracranial findings and chronic microvascular ischemic changes.  -- CTA of the head and neck with contrast also performed in the ED on 04/06/22 for evaluation of left eye vision changes showed no emergent large vessel occlusion or hemodynamically significant  stenosis of the head or neck, and mild left carotid bifurcation atherosclerosis without  hemodynamically significant stenosis.  -- MRI of the brain without contrast performed in the ED on 04/15/22 showed no evidence of acute intracranial abnormality.   ***  Allergies:  is allergic to latex, sulfa antibiotics, and phenazopyridine.  Meds: Current Outpatient Medications  Medication Sig Dispense Refill   Calcium Carbonate-Vitamin D 600-200 MG-UNIT TABS Take by mouth.     DULoxetine (CYMBALTA) 60 MG capsule Take 60 mg by mouth 2 (two) times daily.      hydrochlorothiazide (HYDRODIURIL) 12.5 MG tablet Take 12.5 mg by mouth daily.     hydrOXYzine (ATARAX) 10 MG tablet Take by mouth.     losartan (COZAAR) 50 MG tablet Take 50 mg by mouth daily.     Multiple Vitamin (MULTIVITAMIN WITH MINERALS) TABS tablet Take 1 tablet by mouth daily.     naproxen sodium (ALEVE) 220 MG tablet Take 220-440 mg by mouth daily as needed (pain).     Probiotic Product (CULTURELLE PROBIOTICS PO) Take 1 capsule by mouth daily.     No current facility-administered medications for this encounter.    Physical Findings: The patient is in no acute distress. Patient is alert and oriented.  vitals were not taken for this visit. .  No significant changes. Lungs are clear to auscultation bilaterally. Heart has regular rate and rhythm. No palpable cervical, supraclavicular, or axillary adenopathy. Abdomen soft, non-tender, normal bowel sounds.  On pelvic examination the external genitalia were unremarkable. A speculum exam was performed. There are no mucosal lesions noted in the vaginal vault. A Pap smear was obtained of the proximal vagina. On bimanual and rectovaginal examination there were no  pelvic masses appreciated. ***   Lab Findings: Lab Results  Component Value Date   WBC 6.0 09/30/2018   HGB 11.4 (L) 09/30/2018   HCT 35.5 (L) 09/30/2018   MCV 88.5 09/30/2018   PLT 223 09/30/2018    Radiographic Findings: No results found.  Impression: Stage IA, pT1a, pN0 high grade endometrioid adenocarcinoma    The patient is recovering from the effects of radiation.  ***  Plan:  ***   *** minutes of total time was spent for this patient encounter, including preparation, face-to-face counseling with the patient and coordination of care, physical exam, and documentation of the encounter. ____________________________________  Blair Promise, PhD, MD  This document serves as a record of services personally performed by Gery Pray, MD. It was created on his behalf by Roney Mans, a trained medical scribe. The creation of this record is based on the scribe's personal observations and the provider's statements to them. This document has been checked and approved by the attending provider.

## 2022-06-01 ENCOUNTER — Encounter: Payer: Self-pay | Admitting: Radiation Oncology

## 2022-06-01 ENCOUNTER — Ambulatory Visit
Admission: RE | Admit: 2022-06-01 | Discharge: 2022-06-01 | Disposition: A | Discharge status: Home / Self Care | Payer: Medicare Other | Source: Ambulatory Visit | Attending: Radiation Oncology | Admitting: Radiation Oncology

## 2022-06-01 VITALS — BP 142/60 | HR 75 | Temp 97.6°F | Resp 20 | Ht 64.0 in | Wt 168.6 lb

## 2022-06-01 DIAGNOSIS — Z8542 Personal history of malignant neoplasm of other parts of uterus: Secondary | ICD-10-CM | POA: Diagnosis present

## 2022-06-01 DIAGNOSIS — Z79899 Other long term (current) drug therapy: Secondary | ICD-10-CM | POA: Insufficient documentation

## 2022-06-01 DIAGNOSIS — C541 Malignant neoplasm of endometrium: Secondary | ICD-10-CM

## 2022-06-01 DIAGNOSIS — K5900 Constipation, unspecified: Secondary | ICD-10-CM | POA: Insufficient documentation

## 2022-06-05 IMAGING — CT CT ABD-PELV W/ CM
1 of 2 series · 1 of 18 positions shown · IV contrast (omnipaque)
Comparison: CT of the abdomen and pelvis 03/07/2018.

CLINICAL DATA: 74-year-old female with history of uterine cancer.
Abdominal pain with bowel movements. Evaluate for potential
recurrent disease.

EXAM:
CT ABDOMEN AND PELVIS WITH CONTRAST
TECHNIQUE: Multidetector CT imaging of the abdomen and pelvis was performed
using the standard protocol following bolus administration of
intravenous contrast.
CONTRAST:  100mL OMNIPAQUE IOHEXOL 300 MG/ML  SOLN

[Series 388: — · 0.13mm/px · 1 of 8 slices shown]
[im 5/8]
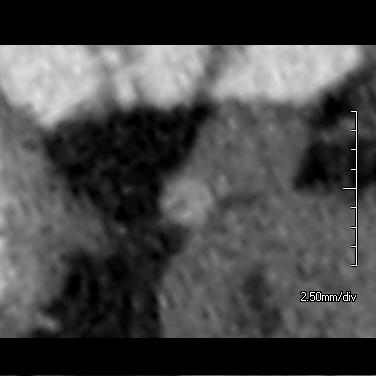

[1 of 18 positions shown; findings below may reference images not displayed]

FINDINGS: Lower chest: Unremarkable.

Hepatobiliary: No suspicious cystic or solid hepatic lesions. No
intra or extrahepatic biliary ductal dilatation. Status post
cholecystectomy.

Pancreas: No pancreatic mass. No pancreatic ductal dilatation. No
pancreatic or peripancreatic fluid collections or inflammatory
changes.

Spleen: Unremarkable.

Adrenals/Urinary Tract: Subcentimeter low-attenuation lesion in the
upper pole the right kidney, too small to characterize, but
statistically likely to represent a tiny cyst. Left kidney and
bilateral adrenal glands are normal in appearance. No
hydroureteronephrosis. Urinary bladder is normal in appearance.

Stomach/Bowel: Normal appearance of the stomach. No pathologic
dilatation of small bowel or colon. The appendix is not confidently
identified and may be surgically absent. Regardless, there are no
inflammatory changes noted adjacent to the cecum to suggest the
presence of an acute appendicitis at this time.

Vascular/Lymphatic: Aortic atherosclerosis, without evidence of
aneurysm or dissection in the abdominal or pelvic vasculature. No
lymphadenopathy noted in the abdomen or pelvis.

Reproductive: Status post hysterectomy. Ovaries are not confidently
identified may be surgically absent or atrophic.

Other: No significant volume of ascites.  No pneumoperitoneum.

Musculoskeletal: There are no aggressive appearing lytic or blastic
lesions noted in the visualized portions of the skeleton.
IMPRESSION: 1. No acute findings are noted in the abdomen or pelvis to account
for the patient's symptoms.
2. No definite of evidence to suggest metastatic disease in the
abdomen or pelvis.
3. Aortic atherosclerosis.

## 2022-08-31 DIAGNOSIS — H524 Presbyopia: Secondary | ICD-10-CM | POA: Diagnosis not present

## 2022-08-31 DIAGNOSIS — H3563 Retinal hemorrhage, bilateral: Secondary | ICD-10-CM | POA: Diagnosis not present

## 2022-08-31 DIAGNOSIS — H401131 Primary open-angle glaucoma, bilateral, mild stage: Secondary | ICD-10-CM | POA: Diagnosis not present

## 2022-08-31 DIAGNOSIS — H35033 Hypertensive retinopathy, bilateral: Secondary | ICD-10-CM | POA: Diagnosis not present

## 2022-08-31 DIAGNOSIS — G43B0 Ophthalmoplegic migraine, not intractable: Secondary | ICD-10-CM | POA: Diagnosis not present

## 2022-09-24 DIAGNOSIS — H35033 Hypertensive retinopathy, bilateral: Secondary | ICD-10-CM | POA: Diagnosis not present

## 2022-09-24 DIAGNOSIS — H401111 Primary open-angle glaucoma, right eye, mild stage: Secondary | ICD-10-CM | POA: Diagnosis not present

## 2022-09-24 DIAGNOSIS — H401131 Primary open-angle glaucoma, bilateral, mild stage: Secondary | ICD-10-CM | POA: Diagnosis not present

## 2022-10-06 ENCOUNTER — Telehealth: Payer: Self-pay | Admitting: *Deleted

## 2022-10-06 DIAGNOSIS — H401121 Primary open-angle glaucoma, left eye, mild stage: Secondary | ICD-10-CM | POA: Diagnosis not present

## 2022-10-06 NOTE — Telephone Encounter (Signed)
Shirley from radiation called and scheduled the patient for a follow up appt with  *Dr Pricilla Holm on 8/2 at 1 pm. Appt scheduled and Talbert Forest will contact the patient with the appt date/time.

## 2022-10-06 NOTE — Telephone Encounter (Signed)
CALLED PATIENT TO INFORM OF FU APPT. WITH DR. Pricilla Holm ON 11-27-22- ARRIVAL TIME- 12:30 PM, LVM FOR A RETURN CALL

## 2022-11-17 DIAGNOSIS — H401131 Primary open-angle glaucoma, bilateral, mild stage: Secondary | ICD-10-CM | POA: Diagnosis not present

## 2022-11-24 ENCOUNTER — Encounter: Payer: Self-pay | Admitting: Gynecologic Oncology
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
## 2022-11-27 ENCOUNTER — Inpatient Hospital Stay: Payer: Medicare Other | Attending: Gynecologic Oncology | Admitting: Gynecologic Oncology

## 2022-11-27 ENCOUNTER — Other Ambulatory Visit: Payer: Self-pay

## 2022-11-27 ENCOUNTER — Encounter: Payer: Self-pay | Admitting: Gynecologic Oncology

## 2022-11-27 VITALS — BP 131/51 | HR 79 | Temp 98.1°F | Resp 18 | Ht 64.0 in

## 2022-11-27 DIAGNOSIS — Z90710 Acquired absence of both cervix and uterus: Secondary | ICD-10-CM | POA: Diagnosis not present

## 2022-11-27 DIAGNOSIS — Z923 Personal history of irradiation: Secondary | ICD-10-CM | POA: Diagnosis not present

## 2022-11-27 DIAGNOSIS — Z08 Encounter for follow-up examination after completed treatment for malignant neoplasm: Secondary | ICD-10-CM | POA: Diagnosis not present

## 2022-11-27 DIAGNOSIS — Z90722 Acquired absence of ovaries, bilateral: Secondary | ICD-10-CM | POA: Insufficient documentation

## 2022-11-27 DIAGNOSIS — Z8542 Personal history of malignant neoplasm of other parts of uterus: Secondary | ICD-10-CM | POA: Insufficient documentation

## 2022-11-27 DIAGNOSIS — C541 Malignant neoplasm of endometrium: Secondary | ICD-10-CM

## 2022-11-27 DIAGNOSIS — Z853 Personal history of malignant neoplasm of breast: Secondary | ICD-10-CM | POA: Diagnosis not present

## 2022-11-27 NOTE — Patient Instructions (Signed)
It was good to see you today.  I do not see or feel any evidence of cancer recurrence on your exam.  I will see you for follow-up in a year.  Please call my office sometime in the spring or over the summer to schedule that visit.  At that point, you will be more than 5 years out from finishing her treatment and we will release you to continued yearly visits with an OB/GYN.  As always, if you develop any new and concerning symptoms before your next visit, please call to see me sooner.

## 2022-12-04 DIAGNOSIS — Z760 Encounter for issue of repeat prescription: Secondary | ICD-10-CM | POA: Diagnosis not present

## 2022-12-04 DIAGNOSIS — I10 Essential (primary) hypertension: Secondary | ICD-10-CM | POA: Diagnosis not present

## 2023-01-14 DIAGNOSIS — K581 Irritable bowel syndrome with constipation: Secondary | ICD-10-CM | POA: Diagnosis not present

## 2023-01-14 DIAGNOSIS — I10 Essential (primary) hypertension: Secondary | ICD-10-CM | POA: Diagnosis not present

## 2023-01-14 DIAGNOSIS — Z23 Encounter for immunization: Secondary | ICD-10-CM | POA: Diagnosis not present

## 2023-01-20 ENCOUNTER — Encounter (INDEPENDENT_AMBULATORY_CARE_PROVIDER_SITE_OTHER): Payer: Self-pay | Admitting: *Deleted

## 2023-01-26 ENCOUNTER — Ambulatory Visit (INDEPENDENT_AMBULATORY_CARE_PROVIDER_SITE_OTHER): Payer: Medicare Other | Admitting: Gastroenterology

## 2023-01-26 ENCOUNTER — Encounter (INDEPENDENT_AMBULATORY_CARE_PROVIDER_SITE_OTHER): Payer: Self-pay | Admitting: Gastroenterology

## 2023-01-26 VITALS — BP 131/74 | HR 81 | Temp 97.9°F | Ht 64.0 in | Wt 170.5 lb

## 2023-01-26 DIAGNOSIS — K581 Irritable bowel syndrome with constipation: Secondary | ICD-10-CM | POA: Insufficient documentation

## 2023-01-26 DIAGNOSIS — R1310 Dysphagia, unspecified: Secondary | ICD-10-CM | POA: Diagnosis not present

## 2023-01-26 DIAGNOSIS — Z1211 Encounter for screening for malignant neoplasm of colon: Secondary | ICD-10-CM | POA: Insufficient documentation

## 2023-01-26 DIAGNOSIS — R1319 Other dysphagia: Secondary | ICD-10-CM | POA: Insufficient documentation

## 2023-01-26 MED ORDER — POLYETHYLENE GLYCOL 3350 17 G PO PACK
17.0000 g | PACK | Freq: Two times a day (BID) | ORAL | 0 refills | Status: AC
Start: 2023-01-26 — End: 2023-04-26

## 2023-01-26 MED ORDER — PSYLLIUM 58.6 % PO PACK
1.0000 | PACK | Freq: Two times a day (BID) | ORAL | 2 refills | Status: AC
Start: 2023-01-26 — End: 2023-04-26
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
## 2023-01-26 NOTE — Patient Instructions (Addendum)
It was very nice to meet you today, as dicussed with will plan for the following :  1) Ensure adequate fluid intake: Aim for 8 glasses of water daily. Follow a high fiber diet: Include foods such as dates, prunes, pears, and kiwi. Take Miralax twice a day for the first week, then reduce to once daily thereafter. Use Metamucil twice a day.  Linzess works best when taken once a day every day, on an empty stomach, at least 30 minutes before your first meal of the day.  When Linzess is taken daily as directed:  *Constipation relief is typically felt in about a week *IBS-C patients may begin to experience relief from belly pain and overall abdominal symptoms (pain, discomfort, and bloating) in about 1 week,   with symptoms typically improving over 12 weeks.  Diarrhea, nausea or abdominal cramping may occur in the first 2 weeks -keep taking it.  These symptoms should eventually resolve. Please notify us if having more than 4 watery bowel movements per day or fecal soiling accidents.  2) Colonoscopy and Upper endoscopy   3)Start IBGard 1 tablet every 8-12 hours as needed

## 2023-02-08 ENCOUNTER — Telehealth (INDEPENDENT_AMBULATORY_CARE_PROVIDER_SITE_OTHER): Payer: Self-pay | Admitting: *Deleted

## 2023-02-08 ENCOUNTER — Other Ambulatory Visit (INDEPENDENT_AMBULATORY_CARE_PROVIDER_SITE_OTHER): Payer: Self-pay | Admitting: Gastroenterology

## 2023-02-08 DIAGNOSIS — K581 Irritable bowel syndrome with constipation: Secondary | ICD-10-CM

## 2023-02-08 MED ORDER — LINACLOTIDE 145 MCG PO CAPS
145.0000 ug | ORAL_CAPSULE | Freq: Every day | ORAL | 5 refills | Status: DC
Start: 2023-02-08 — End: 2023-02-08

## 2023-02-08 MED ORDER — LINACLOTIDE 72 MCG PO CAPS: 72.0000 ug | ORAL_CAPSULE | Freq: Every day | ORAL | 5 refills | Status: DC

## 2023-02-08 NOTE — Telephone Encounter (Signed)
Medication prescribed

## 2023-02-08 NOTE — Telephone Encounter (Signed)
Pt left vm that linzess was working well and took last sample dose today and wanted a call back.   [phone]

## 2023-02-08 NOTE — Telephone Encounter (Signed)
Pt took last dose today of linzess 72. She was given samples. And wanting refill sent to [facility] pharmacy in [place].

## 2023-02-08 NOTE — Telephone Encounter (Signed)
Pt.notified

## 2023-02-09 ENCOUNTER — Telehealth (INDEPENDENT_AMBULATORY_CARE_PROVIDER_SITE_OTHER): Payer: Self-pay

## 2023-02-09 NOTE — Telephone Encounter (Signed)
Patient called today says she is on Linzess 72 mcg daily, and her pharmacy told her there were two Scripts there at the pharmacy for her, one for 72 mcg and one for 145 mcg, and wanted to know which one she is supposed to take. She says she is doing well on the 72 mcg and did not know which one she should be taking. I advised that per Dr. Marcelino Freestone note from last office visit,   Linzess 72 mcg qday- samples given, if patient is able to tolerate will uptitrate next appointment, and the patient had called back and told Toniann Fail that it worked well and Dr. Tasia Catchings sent in the 72 mcg to the pharmacy. I advised the patient to please tell the pharmacy to cancel the 145 mcg as that was not what we had prescribed and she says she will reach out to them, as she suspects the PCP was the one whom sent in the 145 mcg. I advised that if she start having any issues with constipation taking the 72 mcg she should reach out to [place] and we may need to increase at that time. To please let the PCP know that the Gastroenterologist is managing her constipation. Patient states understanding.   Pt notified.      Franky Macho, MD  to Metro Kung, LPN     16/10/96  4:48 PM Note Medication prescribed      Metro Kung, LPN  to Franky Macho, MD     02/08/23  4:38 PM Note Pt took last dose today of linzess 72. She was given samples. And wanting refill sent to [facility] pharmacy in [place].             02/08/23  3:48 PM Metro Kung, LPN routed this conversation to Metro Kung, LPN   Metro Kung, LPN      04/54/09  3:48 PM Note Pt left vm that linzess was working well and took last sample dose today and wanted a call back.    [phone]      Additional Documentation

## 2023-02-18 DIAGNOSIS — H401131 Primary open-angle glaucoma, bilateral, mild stage: Secondary | ICD-10-CM | POA: Diagnosis not present

## 2023-02-22 ENCOUNTER — Telehealth (INDEPENDENT_AMBULATORY_CARE_PROVIDER_SITE_OTHER): Payer: Self-pay | Admitting: Gastroenterology

## 2023-02-22 MED ORDER — PEG 3350-KCL-NA BICARB-NACL 420 G PO SOLR: 4000.0000 mL | Freq: Once | ORAL | 0 refills | Status: AC

## 2023-02-22 NOTE — Telephone Encounter (Signed)
Pt left voicemail in regards to TCS/EGD. Pt was seen in office by Dr.Ahmed 01/26/23, Returned call to patient. Pt scheduled for 03/22/23 at 9:00am. Prep sent to pharmacy. Instructions will be mailed to patient. Per Dr.Ahmed, needs extended bowel prep  Per [employer]: Notification or Prior Authorization is not required for the requested services You are not required to submit a notification/prior authorization based on the information provided. If you have general questions about the prior authorization requirements, visit [email or web address] > Clinician Resources > Advance and Admission Notification Requirements. The number above acknowledges your notification. Please write this reference number down for future reference. If you would like to request an organization determination, please call us at [phone]. Decision ID #: [id number]

## 2023-03-22 ENCOUNTER — Ambulatory Visit (HOSPITAL_COMMUNITY): Payer: Medicare Other | Admitting: Certified Registered"

## 2023-03-22 ENCOUNTER — Ambulatory Visit (HOSPITAL_COMMUNITY)
Admission: RE | Admit: 2023-03-22 | Discharge: 2023-03-22 | Disposition: A | Discharge status: Home / Self Care | Payer: Medicare Other | Attending: Gastroenterology | Admitting: Gastroenterology

## 2023-03-22 ENCOUNTER — Encounter (HOSPITAL_COMMUNITY): Payer: Self-pay

## 2023-03-22 ENCOUNTER — Other Ambulatory Visit: Payer: Self-pay

## 2023-03-22 ENCOUNTER — Encounter (HOSPITAL_COMMUNITY)
Admission: RE | Disposition: A | Discharge status: Home / Self Care | Payer: Self-pay | Source: Home / Self Care | Attending: Gastroenterology

## 2023-03-22 DIAGNOSIS — M1990 Unspecified osteoarthritis, unspecified site: Secondary | ICD-10-CM | POA: Diagnosis not present

## 2023-03-22 DIAGNOSIS — G709 Myoneural disorder, unspecified: Secondary | ICD-10-CM | POA: Insufficient documentation

## 2023-03-22 DIAGNOSIS — K644 Residual hemorrhoidal skin tags: Secondary | ICD-10-CM | POA: Diagnosis not present

## 2023-03-22 DIAGNOSIS — K581 Irritable bowel syndrome with constipation: Secondary | ICD-10-CM | POA: Diagnosis not present

## 2023-03-22 DIAGNOSIS — D123 Benign neoplasm of transverse colon: Secondary | ICD-10-CM | POA: Insufficient documentation

## 2023-03-22 DIAGNOSIS — F419 Anxiety disorder, unspecified: Secondary | ICD-10-CM | POA: Diagnosis not present

## 2023-03-22 DIAGNOSIS — K3189 Other diseases of stomach and duodenum: Secondary | ICD-10-CM | POA: Diagnosis not present

## 2023-03-22 DIAGNOSIS — R1310 Dysphagia, unspecified: Secondary | ICD-10-CM | POA: Insufficient documentation

## 2023-03-22 DIAGNOSIS — Z1211 Encounter for screening for malignant neoplasm of colon: Secondary | ICD-10-CM | POA: Diagnosis not present

## 2023-03-22 DIAGNOSIS — K648 Other hemorrhoids: Secondary | ICD-10-CM | POA: Diagnosis not present

## 2023-03-22 DIAGNOSIS — Z8249 Family history of ischemic heart disease and other diseases of the circulatory system: Secondary | ICD-10-CM | POA: Diagnosis not present

## 2023-03-22 DIAGNOSIS — R1013 Epigastric pain: Secondary | ICD-10-CM | POA: Diagnosis present

## 2023-03-22 DIAGNOSIS — K5730 Diverticulosis of large intestine without perforation or abscess without bleeding: Secondary | ICD-10-CM | POA: Insufficient documentation

## 2023-03-22 DIAGNOSIS — F32A Depression, unspecified: Secondary | ICD-10-CM | POA: Diagnosis not present

## 2023-03-22 DIAGNOSIS — H8109 Meniere's disease, unspecified ear: Secondary | ICD-10-CM | POA: Insufficient documentation

## 2023-03-22 DIAGNOSIS — K2950 Unspecified chronic gastritis without bleeding: Secondary | ICD-10-CM | POA: Insufficient documentation

## 2023-03-22 DIAGNOSIS — I10 Essential (primary) hypertension: Secondary | ICD-10-CM | POA: Insufficient documentation

## 2023-03-22 DIAGNOSIS — Z139 Encounter for screening, unspecified: Secondary | ICD-10-CM | POA: Diagnosis not present

## 2023-03-22 DIAGNOSIS — K2970 Gastritis, unspecified, without bleeding: Secondary | ICD-10-CM

## 2023-03-22 DIAGNOSIS — K635 Polyp of colon: Secondary | ICD-10-CM

## 2023-03-22 HISTORY — PX: BIOPSY: SHX5522

## 2023-03-22 HISTORY — PX: POLYPECTOMY: SHX149

## 2023-03-22 HISTORY — PX: ESOPHAGOGASTRODUODENOSCOPY (EGD) WITH PROPOFOL: SHX5813

## 2023-03-22 HISTORY — PX: COLONOSCOPY WITH PROPOFOL: SHX5780

## 2023-03-22 HISTORY — PX: SAVORY DILATION: SHX5439

## 2023-03-22 LAB — HM COLONOSCOPY

## 2023-03-22 SURGERY — ESOPHAGOGASTRODUODENOSCOPY (EGD) WITH PROPOFOL
Anesthesia: General

## 2023-03-22 MED ORDER — PROPOFOL 500 MG/50ML IV EMUL
INTRAVENOUS | Status: AC
  Filled 2023-03-22: qty 50

## 2023-03-22 MED ORDER — LACTATED RINGERS IV SOLN: INTRAVENOUS | Status: DC | PRN

## 2023-03-22 MED ORDER — PROPOFOL 500 MG/50ML IV EMUL
INTRAVENOUS | Status: DC | PRN
  Administered 2023-03-22: 125 ug/kg/min via INTRAVENOUS

## 2023-03-22 MED ORDER — PHENYLEPHRINE 80 MCG/ML (10ML) SYRINGE FOR IV PUSH (FOR BLOOD PRESSURE SUPPORT)
PREFILLED_SYRINGE | INTRAVENOUS | Status: DC | PRN
  Administered 2023-03-22: 160 ug via INTRAVENOUS

## 2023-03-22 MED ORDER — LIDOCAINE HCL (CARDIAC) PF 100 MG/5ML IV SOSY
PREFILLED_SYRINGE | INTRAVENOUS | Status: DC | PRN
  Administered 2023-03-22: 100 mg via INTRAVENOUS

## 2023-03-22 MED ORDER — EPHEDRINE 5 MG/ML INJ
INTRAVENOUS | Status: AC
  Filled 2023-03-22: qty 5

## 2023-03-22 MED ORDER — LIDOCAINE HCL (PF) 2 % IJ SOLN
INTRAMUSCULAR | Status: AC
Start: 2023-03-22 — End: ?
  Filled 2023-03-22: qty 5

## 2023-03-22 MED ORDER — PROPOFOL 10 MG/ML IV BOLUS
INTRAVENOUS | Status: DC | PRN
  Administered 2023-03-22: 50 mg via INTRAVENOUS
  Administered 2023-03-22: 30 mg via INTRAVENOUS
  Administered 2023-03-22: 80 mg via INTRAVENOUS
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
## 2023-03-22 NOTE — Discharge Instructions (Signed)

## 2023-03-22 NOTE — Op Note (Addendum)
[facility] Patient Name: Alexandra Haynes Procedure Date: 03/22/2023 8:02 AM MRN: [id number] Date of Birth: 19-Dec-1945 Attending MD: Sanjuan Dame , MD, [id number] CSN: [id number] Age: 77 Admit Type: Outpatient Procedure:                Upper GI endoscopy Indications:              Dyspepsia, Dysphagia Providers:                Sanjuan Dame, MD, Crystal Page, Pandora Leiter,                            Technician Referring MD:              Medicines:                Monitored Anesthesia Care Complications:            No immediate complications. Estimated Blood Loss:     Estimated blood loss: none. Estimated blood loss                            was minimal. Procedure:                Pre-Anesthesia Assessment:                           - Prior to the procedure, a History and Physical                            was performed, and patient medications and                            allergies were reviewed. The patient's tolerance of                            previous anesthesia was also reviewed. The risks                            and benefits of the procedure and the sedation                            options and risks were discussed with the patient.                            All questions were answered, and informed consent                            was obtained. Prior Anticoagulants: The patient has                            taken no anticoagulant or antiplatelet agents                            except for aspirin. ASA Grade Assessment: II - A                            patient with  mild systemic disease. After reviewing                            the risks and benefits, the patient was deemed in                            satisfactory condition to undergo the procedure.                           After obtaining informed consent, the endoscope was                            passed under direct vision. Throughout the                            procedure, the patient's  blood pressure, pulse, and                            oxygen saturations were monitored continuously. The                            [id number]-H190 ([id number]) scope was introduced through the                            mouth, and advanced to the second part of duodenum.                            The upper GI endoscopy was accomplished without                            difficulty. The patient tolerated the procedure                            well. Scope In: 8:11:00 AM Scope Out: 8:18:32 AM Total Procedure Duration: 0 hours 7 minutes 32 seconds  Findings:      No endoscopic abnormality was evident in the esophagus to explain the       patient's complaint of dysphagia. It was decided, however, to proceed       with dilation of the entire esophagus. A guidewire was placed and the       scope was withdrawn. Dilation was performed with a Savary dilator with       moderate resistance at 18 Fr. The dilation site was examined following       endoscope reinsertion and showed mild mucosal disruption.      Patchy mild inflammation characterized by erosions was found in the       gastric body. Biopsies were taken with a cold forceps for histology.      Mildly erythematous mucosa without active bleeding and with no stigmata       of bleeding was found in the duodenal bulb and in the second portion of       the duodenum. Biopsies for histology were taken with a cold forceps for       evaluation of celiac disease. Impression:               -  No endoscopic esophageal abnormality to explain                            patient's dysphagia. Esophagus dilated. Dilated.                           - Gastritis. Biopsied.                           - Erythematous duodenopathy. Biopsied. Moderate Sedation:      Per Anesthesia Care Recommendation:           - Patient has a contact number available for                            emergencies. The signs and symptoms of potential                            delayed  complications were discussed with the                            patient. Return to normal activities tomorrow.                            Written discharge instructions were provided to the                            patient.                           - Resume previous diet.                           - Continue present medications.                           - Await pathology results. Procedure Code(s):        --- Professional ---                           [phone], Esophagogastroduodenoscopy, flexible,                            transoral; with insertion of guide wire followed by                            passage of dilator(s) through esophagus over guide                            wire                           [id number], 59, Esophagogastroduodenoscopy, flexible,                            transoral; with biopsy, single or multiple Diagnosis Code(s):        --- Professional ---  R13.10, Dysphagia, unspecified                           K29.70, Gastritis, unspecified, without bleeding                           K31.89, Other diseases of stomach and duodenum                           R10.13, Epigastric pain CPT copyright 2022 American Medical Association. All rights reserved. The codes documented in this report are preliminary and upon coder review may  be revised to meet current compliance requirements. Sanjuan Dame, MD Sanjuan Dame, MD 03/22/2023 8:22:32 AM This report has been signed electronically. Number of Addenda: 0

## 2023-03-22 NOTE — Op Note (Signed)
[facility] Patient Name: Alexandra Haynes Procedure Date: 03/22/2023 8:01 AM MRN: [id number] Date of Birth: [birth year] Attending MD: Sanjuan Dame , MD, [id number] CSN: [id number] Age: 77 Admit Type: Outpatient Procedure:                Colonoscopy Indications:              Screening for colorectal malignant neoplasm Providers:                Sanjuan Dame, MD, Crystal Page, Pandora Leiter,                            Technician Referring MD:              Medicines:                Monitored Anesthesia Care Complications:            No immediate complications. Estimated Blood Loss:     Estimated blood loss: none. Procedure:                Pre-Anesthesia Assessment:                           - Prior to the procedure, a History and Physical                            was performed, and patient medications and                            allergies were reviewed. The patient's tolerance of                            previous anesthesia was also reviewed. The risks                            and benefits of the procedure and the sedation                            options and risks were discussed with the patient.                            All questions were answered, and informed consent                            was obtained. Prior Anticoagulants: The patient has                            taken no anticoagulant or antiplatelet agents                            except for aspirin. ASA Grade Assessment: II - A                            patient with mild systemic disease. After reviewing  the risks and benefits, the patient was deemed in                            satisfactory condition to undergo the procedure.                           After obtaining informed consent, the colonoscope                            was passed under direct vision. Throughout the                            procedure, the patient's blood pressure, pulse, and                             oxygen saturations were monitored continuously. The                            [id number]-[id number] ([id number] scope was introduced through                            the anus and advanced to the the cecum, identified                            by appendiceal orifice and ileocecal valve. The                            colonoscopy was performed without difficulty. The                            patient tolerated the procedure well. The quality                            of the bowel preparation was evaluated using the                            BBPS [facility] Bowel Preparation Scale) with scores                            of: Right Colon = 2 (minor amount of residual                            staining, small fragments of stool and/or opaque                            liquid, but mucosa seen well), Transverse Colon = 2                            (minor amount of residual staining, small fragments                            of stool and/or opaque liquid, but mucosa seen  well) and Left Colon = 2 (minor amount of residual                            staining, small fragments of stool and/or opaque                            liquid, but mucosa seen well). The total BBPS score                            equals 6. The ileocecal valve, appendiceal orifice,                            and rectum were photographed. Scope In: 8:25:55 AM Scope Out: 9:00:01 AM Scope Withdrawal Time: 0 hours 17 minutes 21 seconds  Total Procedure Duration: 0 hours 34 minutes 6 seconds  Findings:      The perianal and digital rectal examinations were normal.      Two sessile polyps were found in the hepatic flexure. The polyps were 2       to 3 mm in size. These polyps were removed with a cold biopsy forceps.       Resection and retrieval were complete.      A few diverticula were found in the left colon.      Non-bleeding external and internal hemorrhoids were found during       retroflexion. The  hemorrhoids were small. Impression:               - Two 2 to 3 mm polyps at the hepatic flexure,                            removed with a cold biopsy forceps. Resected and                            retrieved.                           - Diverticulosis in the left colon.                           - Non-bleeding external and internal hemorrhoids. Moderate Sedation:      Per Anesthesia Care Recommendation:           - Patient has a contact number available for                            emergencies. The signs and symptoms of potential                            delayed complications were discussed with the                            patient. Return to normal activities tomorrow.                            Written discharge instructions were provided to the  patient.                           - Resume previous diet.                           - Continue present medications.                           - Await pathology results.                           - Repeat colonoscopy in 7-10 years for screening                            purposes.                           - Return to primary care physician as previously                            scheduled. Procedure Code(s):        --- Professional ---                           [phone], Colonoscopy, flexible; with biopsy, single                            or multiple Diagnosis Code(s):        --- Professional ---                           Z12.11, Encounter for screening for malignant                            neoplasm of colon                           D12.3, Benign neoplasm of transverse colon (hepatic                            flexure or splenic flexure)                           K64.8, Other hemorrhoids                           K57.30, Diverticulosis of large intestine without                            perforation or abscess without bleeding CPT copyright 2022 American Medical Association. All rights reserved. The  codes documented in this report are preliminary and upon coder review may  be revised to meet current compliance requirements. Sanjuan Dame, MD Sanjuan Dame, MD 03/22/2023 9:04:34 AM This report has been signed electronically. Number of Addenda: 0

## 2023-03-22 NOTE — Anesthesia Preprocedure Evaluation (Addendum)
Anesthesia Evaluation  Patient identified by MRN, date of birth, ID band Patient awake    Reviewed: Allergy & Precautions, NPO status , Patient's Chart, lab work & pertinent test results  History of Anesthesia Complications (+) PONV and history of anesthetic complications  Airway Mallampati: II  TM Distance: >3 FB Neck ROM: Full    Dental no notable dental hx. (+) Teeth Intact, Dental Advisory Given   Pulmonary neg pulmonary ROS   Pulmonary exam normal breath sounds clear to auscultation       Cardiovascular hypertension, Pt. on medications Normal cardiovascular exam Rhythm:Regular Rate:Normal     Neuro/Psych  PSYCHIATRIC DISORDERS Anxiety Depression    Meniere's disease  Neuromuscular disease    GI/Hepatic Neg liver ROS,GERD  ,,  Endo/Other  negative endocrine ROS    Renal/GU negative Renal ROS     Musculoskeletal  (+) Arthritis ,    Abdominal Normal abdominal exam  (+)   Peds  Hematology  (+) Blood dyscrasia, anemia   Anesthesia Other Findings Day of surgery medications reviewed with the patient.  H/o breast cancer   Reproductive/Obstetrics Endometrial cancer                             Anesthesia Physical Anesthesia Plan  ASA: 2  Anesthesia Plan: General   Post-op Pain Management: Minimal or no pain anticipated   Induction: Intravenous  PONV Risk Score and Plan: Propofol infusion  Airway Management Planned: Nasal Cannula and Natural Airway  Additional Equipment: None  Intra-op Plan:   Post-operative Plan:   Informed Consent: I have reviewed the patients History and Physical, chart, labs and discussed the procedure including the risks, benefits and alternatives for the proposed anesthesia with the patient or authorized representative who has indicated his/her understanding and acceptance.     Dental advisory given  Plan Discussed with: CRNA  Anesthesia Plan  Comments: (2nd IV after induction)        Anesthesia Quick Evaluation

## 2023-03-22 NOTE — Transfer of Care (Addendum)
Immediate Anesthesia Transfer of Care Note  Patient: Alexandra Haynes  Procedure(s) Performed: ESOPHAGOGASTRODUODENOSCOPY (EGD) WITH PROPOFOL COLONOSCOPY WITH PROPOFOL BIOPSY SAVORY DILATION POLYPECTOMY INTESTINAL  Patient Location: Endoscopy Unit  Anesthesia Type:General  Level of Consciousness: drowsy and patient cooperative  Airway & Oxygen Therapy: Patient Spontanous Breathing and Patient connected to nasal cannula oxygen  Post-op Assessment: Report given to RN and Post -op Vital signs reviewed and stable  Post vital signs: Reviewed and stable  Last Vitals:  Vitals Value Taken Time  BP 109/50 03/22/23   0906  Temp 36.6 03/22/23   0906  Pulse 70 03/22/23   0906  Resp 12 03/22/23   0906  SpO2 100% 03/22/23   0906    Last Pain:  Vitals:   03/22/23 0806  TempSrc:   PainSc: 0-No pain      Patients Stated Pain Goal: 8 (03/22/23 0745)  Complications: No notable events documented.

## 2023-03-22 NOTE — Anesthesia Postprocedure Evaluation (Signed)
Anesthesia Post Note  Patient: Glenys A Riepe  Procedure(s) Performed: ESOPHAGOGASTRODUODENOSCOPY (EGD) WITH PROPOFOL COLONOSCOPY WITH PROPOFOL BIOPSY SAVORY DILATION POLYPECTOMY INTESTINAL  Patient location during evaluation: PACU Anesthesia Type: General Level of consciousness: awake and alert Pain management: pain level controlled Vital Signs Assessment: post-procedure vital signs reviewed and stable Respiratory status: spontaneous breathing, nonlabored ventilation, respiratory function stable and patient connected to nasal cannula oxygen Cardiovascular status: blood pressure returned to baseline and stable Postop Assessment: no apparent nausea or vomiting Anesthetic complications: no   There were no known notable events for this encounter.   Last Vitals:  Vitals:   03/22/23 0745 03/22/23 0906  BP: (!) 153/69 (!) 109/50  Pulse: 79 70  Resp: 20 12  Temp: 36.6 C 36.6 C  SpO2: 97% 100%    Last Pain:  Vitals:   03/22/23 0906  TempSrc: Axillary  PainSc: 0-No pain                 Janise Gora L Amenda Duclos

## 2023-03-23 ENCOUNTER — Encounter (INDEPENDENT_AMBULATORY_CARE_PROVIDER_SITE_OTHER): Payer: Self-pay | Admitting: *Deleted

## 2023-03-24 LAB — SURGICAL PATHOLOGY

## 2023-03-25 ENCOUNTER — Other Ambulatory Visit (HOSPITAL_COMMUNITY): Payer: Self-pay | Admitting: Gastroenterology

## 2023-03-25 DIAGNOSIS — A048 Other specified bacterial intestinal infections: Secondary | ICD-10-CM

## 2023-03-25 MED ORDER — BISMUTH 262 MG PO CHEW: 2.0000 | CHEWABLE_TABLET | Freq: Four times a day (QID) | ORAL | 0 refills | Status: AC

## 2023-03-25 MED ORDER — METRONIDAZOLE 500 MG PO TABS: 500.0000 mg | ORAL_TABLET | Freq: Three times a day (TID) | ORAL | 0 refills | Status: AC

## 2023-03-25 MED ORDER — DOXYCYCLINE MONOHYDRATE 100 MG PO TABS: 100.0000 mg | ORAL_TABLET | Freq: Two times a day (BID) | ORAL | 0 refills | Status: AC

## 2023-03-25 MED ORDER — PANTOPRAZOLE SODIUM 40 MG PO TBEC: 40.0000 mg | DELAYED_RELEASE_TABLET | Freq: Two times a day (BID) | ORAL | 0 refills | Status: DC

## 2023-03-25 NOTE — Progress Notes (Signed)
I reviewed the pathology results. Ann, can you send her a letter with the findings as described below please?  Thanks,  Vista Lawman, MD Gastroenterology and Hepatology [facility] Gastroenterology  ---------------------------------------------------------------------------------------------  [facility] [address], [place], [place] [address] Phone:  [phone]   03/25/23 [name], [place]   Dear Alexandra Haynes,  I am writing to inform you that the biopsies taken during your recent endoscopic examination showed:  Colonoscopy :  I am writing to let you know the results of your recent colonoscopy.  You had a total of 2 polyps removed. The pathology came back as "tubular adenoma." These findings are NOT cancer, but had the polyps remained in your colon, they could have turned into cancer.  Given these findings, it is recommended that your next colonoscopy be performed in 7 years but you will be over age 33 .As per [place]  Multi-Society Task Force on Colorectal Cancer recommendation ; CRC screening colonoscopy is not recommended after age 31.   Upper endoscopy :  I am writing to let you know the results of your recent endoscopy (EGD).  You were found to have an infection called H. pylori, which is a bacteria that lives in the stomach. We will send you a pack of medications to take for 14 days: 3 antibiotics and an acid blocking medication Pantoprazole . Take them all twice a day. It is VERY IMPORTANT that you take all of the medications as directed. If the infection is not fully treated, in can increase your risk of stomach cancer.  Also I value your feedback , so if you get a survey , please take the time to fill it out and thank you for choosing /[facility]  Please call us at [phone] if you have persistent problems or have questions about your  condition that have not been fully answered at this time.  Sincerely,  Vista Lawman, MD Gastroenterology and Hepatology

## 2023-03-29 ENCOUNTER — Encounter (HOSPITAL_COMMUNITY): Payer: Self-pay | Admitting: Gastroenterology

## 2023-03-30 ENCOUNTER — Encounter (INDEPENDENT_AMBULATORY_CARE_PROVIDER_SITE_OTHER): Payer: Self-pay | Admitting: *Deleted

## 2023-05-28 ENCOUNTER — Telehealth: Payer: Self-pay | Admitting: *Deleted

## 2023-05-28 NOTE — Telephone Encounter (Signed)
CALLED PATIENT TO ASK ABOUT RESCHEDULING FU ON 06-07-23 DUE TO DR. KINARD BEING IN THE OR, SPOKE WITH PATIENT AND SHE AGREED TO COME ON 06-17-23 @ 8:30 AM, APPT. HAS BEEN BOOKED

## 2023-06-07 ENCOUNTER — Ambulatory Visit: Payer: Self-pay | Admitting: Radiation Oncology

## 2023-06-17 ENCOUNTER — Ambulatory Visit: Payer: Medicare Other | Admitting: Radiation Oncology

## 2023-06-29 NOTE — Progress Notes (Signed)
 Alexandra Haynes is here today for follow up post radiation to the pelvic.  They completed their radiation on: 08/04/18   Does the patient complain of any of the following:  Pain:Reports having intermittent abdominal pain in the mornings.  Abdominal bloating: No Diarrhea/Constipation: No Nausea/Vomiting: No Vaginal Discharge: No Blood in Urine or Stool: No Urinary Issues (dysuria/incomplete emptying/ incontinence/ increased frequency/urgency): Yes, dysuria.  Does patient report using vaginal dilator 2-3 times a week and/or sexually active 2-3 weeks: No Post radiation skin changes: Bump on the left side of vagina. Patient reports area has been there for 2-3 months.    Additional comments if applicable:  BP 139/75 (BP Location: Right Arm, Patient Position: Sitting)   Pulse 77   Temp (!) 97.1 F (36.2 C) (Temporal)   Resp 18   Ht 5\' 4"  (1.626 m)   Wt 159 lb 8 oz (72.3 kg)   SpO2 100%   BMI 27.38 kg/m

## 2023-06-30 NOTE — Progress Notes (Signed)
 Radiation Oncology         [phone] ________________________________  Name: Alexandra Haynes MRN: [id number]  Date: 07/01/2023  DOB: [birth year]  Follow-Up Visit Note  CC: Alexandra Palau, MD  Alexandra Birchwood, MD    ICD-10-CM   1. Endometrial cancer (HCC)  C54.1       Diagnosis:  Stage IA, pT1a, pN0 high grade endometrioid adenocarcinoma    Interval Since Last Radiation: 4 years 11 months  3/10, 3/19, 3/26, 4/2, 08/04/2018: Vaginal Cuff (HDR brachytherapy; 3 cm cylinder, 3.5 cm length) / 30 Gy in 5 fractions   Narrative:  The patient returns today for her routine yearly follow-up. She was last seen in office on 06-01-22.   Since then, she continued to follow up with her specialist for management of her chronic conditions including IBS and high blood pressure. She presented for a follow up with Alexandra Haynes on 11-27-22. At that time, she denied any vaginal bleeding or discharge. She denied any symptoms or concerns indication disease recurrence. Dr. Pricilla Haynes noted no evidence of disease on physical exam.     No other significant oncologic interval history since the patient was last seen.   Of note, she was seen by GI for her IBS issues. She was found to have an H.pylori infection and has since completed treatment for this. She states her stomach still feels "weird" at times since completing the treatment.   Today, the patient notes a 2-3 month history of a "bump" on her labia. She states this is painful with urination, but otherwise denies any bleeding, discharge, or any other issues from this. She notes some mild urinary leakage throughout the day, but this is not bothersome to her. She denies any vaginal bleeding, abnormal discharge, or other changes to her bladder habits.    Allergies:  is allergic to latex, sulfa antibiotics, and phenazopyridine.  Meds: Current Outpatient Medications  Medication Sig Dispense Refill   amLODipine (NORVASC) 5 MG tablet Take by mouth.      Calcium Carbonate-Vitamin D 600-200 MG-UNIT TABS Take by mouth.     DULoxetine (CYMBALTA) 60 MG capsule Take 60 mg by mouth 2 (two) times daily.      linaclotide (LINZESS) 72 MCG capsule Take 1 capsule (72 mcg total) by mouth daily before breakfast. 30 capsule 5   losartan (COZAAR) 50 MG tablet Take 50 mg by mouth daily.     Multiple Vitamin (MULTIVITAMIN WITH MINERALS) TABS tablet Take 1 tablet by mouth daily.     naproxen sodium (ALEVE) 220 MG tablet Take 220-440 mg by mouth daily as needed (pain).     pantoprazole (PROTONIX) 40 MG tablet Take 1 tablet (40 mg total) by mouth 2 (two) times daily for 14 days. 28 tablet 0   Probiotic Product (CULTURELLE PROBIOTICS PO) Take 1 capsule by mouth daily.     hydrOXYzine (ATARAX) 10 MG tablet Take by mouth. (Patient not taking: Reported on 07/01/2023)     No current facility-administered medications for this encounter.    Physical Findings: The patient is in no acute distress. Patient is alert and oriented.  height is 5\' 4"  (1.626 m) and weight is 159 lb 8 oz (72.3 kg). Her temporal temperature is 97.1 F (36.2 C) (abnormal). Her blood pressure is 139/75 and her pulse is 77. Her respiration is 18 and oxygen saturation is 100%. . Lungs are clear to auscultation bilaterally. Heart has regular rate and rhythm. No palpable cervical, supraclavicular, or axillary adenopathy. Abdomen soft, non-tender,  normal bowel sounds.  GU: Normal appearing external genitalia without erythema, excoriation, or lesion.  Careful palpation and examination of the left labial area reveals no suspicious changes.  A speculum exam was performed. There are no mucosal lesions noted in the vaginal vault.  Prominent mucosal tag located along the right vaginal sidewall measuring approximately 1 cm which has been noticed on previous exams.  On bimanual and rectovaginal examination there were no pelvic masses appreciated.  Vaginal cuff intact.  Rectal sphincter tone good.   Lab Findings: Lab  Results  Component Value Date   WBC 6.0 09/30/2018   HGB 11.4 (L) 09/30/2018   HCT 35.5 (L) 09/30/2018   MCV 88.5 09/30/2018   PLT 223 09/30/2018    Radiographic Findings: No results found.  Impression: Stage IA, pT1a, pN0 high grade endometrioid adenocarcinoma   The patient is doing well overall with no evidence of disease recurrence on clinical exam today.   No mass was palpated or visualized to correlate with the patient's complaint of a "bump" on her labia. Encouraged patient to follow-up with Dr. Pricilla Haynes sooner if she experiences worsening symptoms from this.    Plan: Patient is almost 5 years out from her treatment. Per NCCN guidelines, patient will follow up with Dr. Pricilla Haynes in 6 months and can proceed with annual GYN follow-up if NED at that time.  Radiation follow-up PRN.   We appreciate the opportunity to take part in this patient's care. Patient was educated on signs/symptoms that may indicate disease recurrence and understands to notify us if she experiences any of them.  Encouraged patient to follow-up with GI for residual stomach issues since completing her H.pylori treatment.   30 minutes of total time was spent for this patient encounter, including preparation, face-to-face counseling with the patient and coordination of care, physical exam, and documentation of the encounter. ____________________________________   Alexandra Lemma, PA-C   Billie Lade, PhD, MD   [facility]  Radiation Oncology Direct Dial: [phone]  Fax: [phone] [email or web address]    This document serves as a record of services personally performed by Alexandra Blackbird, MD and Alexandra Lemma, PA-C. It was created on his behalf by Alexandra Haynes, a trained medical scribe. The creation of this record is based on the scribe's personal observations and the provider's statements to them. This document has been checked and approved by the attending provider.

## 2023-07-01 ENCOUNTER — Encounter: Payer: Self-pay | Admitting: Radiation Oncology

## 2023-07-01 ENCOUNTER — Ambulatory Visit
Admission: RE | Admit: 2023-07-01 | Discharge: 2023-07-01 | Disposition: A | Discharge status: Home / Self Care | Payer: Medicare Other | Source: Ambulatory Visit | Attending: Radiation Oncology | Admitting: Radiation Oncology

## 2023-07-01 VITALS — BP 139/75 | HR 77 | Temp 97.1°F | Resp 18 | Ht 64.0 in | Wt 159.5 lb

## 2023-07-01 DIAGNOSIS — Z79899 Other long term (current) drug therapy: Secondary | ICD-10-CM | POA: Diagnosis not present

## 2023-07-01 DIAGNOSIS — C541 Malignant neoplasm of endometrium: Secondary | ICD-10-CM

## 2023-07-01 DIAGNOSIS — Z923 Personal history of irradiation: Secondary | ICD-10-CM | POA: Insufficient documentation

## 2023-07-01 DIAGNOSIS — N9089 Other specified noninflammatory disorders of vulva and perineum: Secondary | ICD-10-CM | POA: Insufficient documentation

## 2023-07-01 DIAGNOSIS — Z8542 Personal history of malignant neoplasm of other parts of uterus: Secondary | ICD-10-CM | POA: Insufficient documentation

## 2023-07-05 ENCOUNTER — Other Ambulatory Visit (INDEPENDENT_AMBULATORY_CARE_PROVIDER_SITE_OTHER): Payer: Self-pay | Admitting: Gastroenterology

## 2023-08-20 DIAGNOSIS — H04123 Dry eye syndrome of bilateral lacrimal glands: Secondary | ICD-10-CM | POA: Diagnosis not present

## 2023-08-20 DIAGNOSIS — H35033 Hypertensive retinopathy, bilateral: Secondary | ICD-10-CM | POA: Diagnosis not present

## 2023-08-20 DIAGNOSIS — H26491 Other secondary cataract, right eye: Secondary | ICD-10-CM | POA: Diagnosis not present

## 2023-08-20 DIAGNOSIS — H401131 Primary open-angle glaucoma, bilateral, mild stage: Secondary | ICD-10-CM | POA: Diagnosis not present

## 2023-09-28 DIAGNOSIS — H401131 Primary open-angle glaucoma, bilateral, mild stage: Secondary | ICD-10-CM | POA: Diagnosis not present

## 2023-09-28 DIAGNOSIS — H26491 Other secondary cataract, right eye: Secondary | ICD-10-CM | POA: Diagnosis not present

## 2023-10-13 DIAGNOSIS — L821 Other seborrheic keratosis: Secondary | ICD-10-CM | POA: Diagnosis not present

## 2023-11-05 ENCOUNTER — Telehealth: Payer: Self-pay | Admitting: *Deleted

## 2023-11-05 NOTE — Telephone Encounter (Signed)
 CALLED PATIENT TO INFORM OF FU APPT. WITH DR. VIKTORIA ON 01-06-24- ARRIVAL TIME- 1 PM, SPOKE WITH PATIENT AND SHE IS AWARE OF THIS APPT.

## 2023-12-28 ENCOUNTER — Telehealth: Payer: Self-pay | Admitting: *Deleted

## 2023-12-28 NOTE — Telephone Encounter (Signed)
 Spoke with patient who called the office to reschedule her appointment on 9/11 with Dr.Tucker. Pt was given a new appt. For 10/31 at 3:15 pm. Pt agreed to date and time and had no further concerns or questions at this time.

## 2023-12-31 DIAGNOSIS — R5383 Other fatigue: Secondary | ICD-10-CM | POA: Diagnosis not present

## 2023-12-31 DIAGNOSIS — Z13820 Encounter for screening for osteoporosis: Secondary | ICD-10-CM | POA: Diagnosis not present

## 2023-12-31 DIAGNOSIS — Z23 Encounter for immunization: Secondary | ICD-10-CM | POA: Diagnosis not present

## 2023-12-31 DIAGNOSIS — R600 Localized edema: Secondary | ICD-10-CM | POA: Diagnosis not present

## 2023-12-31 DIAGNOSIS — M791 Myalgia, unspecified site: Secondary | ICD-10-CM | POA: Diagnosis not present

## 2024-01-06 ENCOUNTER — Inpatient Hospital Stay: Admitting: Gynecologic Oncology

## 2024-01-18 DIAGNOSIS — I890 Lymphedema, not elsewhere classified: Secondary | ICD-10-CM | POA: Diagnosis not present

## 2024-01-24 DIAGNOSIS — I890 Lymphedema, not elsewhere classified: Secondary | ICD-10-CM | POA: Diagnosis not present

## 2024-02-02 ENCOUNTER — Other Ambulatory Visit (INDEPENDENT_AMBULATORY_CARE_PROVIDER_SITE_OTHER): Payer: Self-pay | Admitting: Gastroenterology

## 2024-02-25 ENCOUNTER — Inpatient Hospital Stay: Attending: Gynecologic Oncology | Admitting: Gynecologic Oncology

## 2024-02-25 ENCOUNTER — Encounter: Payer: Self-pay | Admitting: Gynecologic Oncology

## 2024-02-25 VITALS — BP 139/61 | HR 75 | Temp 98.2°F | Resp 19 | Wt 169.0 lb

## 2024-02-25 DIAGNOSIS — Z90710 Acquired absence of both cervix and uterus: Secondary | ICD-10-CM | POA: Insufficient documentation

## 2024-02-25 DIAGNOSIS — Z9079 Acquired absence of other genital organ(s): Secondary | ICD-10-CM | POA: Insufficient documentation

## 2024-02-25 DIAGNOSIS — Z08 Encounter for follow-up examination after completed treatment for malignant neoplasm: Secondary | ICD-10-CM | POA: Diagnosis present

## 2024-02-25 DIAGNOSIS — Z923 Personal history of irradiation: Secondary | ICD-10-CM | POA: Diagnosis not present

## 2024-02-25 DIAGNOSIS — Z8542 Personal history of malignant neoplasm of other parts of uterus: Secondary | ICD-10-CM | POA: Insufficient documentation

## 2024-02-25 DIAGNOSIS — Z90722 Acquired absence of ovaries, bilateral: Secondary | ICD-10-CM | POA: Insufficient documentation

## 2024-02-25 DIAGNOSIS — C541 Malignant neoplasm of endometrium: Secondary | ICD-10-CM

## 2024-02-25 DIAGNOSIS — I890 Lymphedema, not elsewhere classified: Secondary | ICD-10-CM | POA: Diagnosis not present
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
## 2024-02-25 NOTE — Patient Instructions (Signed)
 It was great to see you today.  I do not see or feel any evidence of cancer recurrence on your exam.  Congratulations on being more than 5 years out from finishing your treatment for uterine cancer.  I recommend that you continue to see someone at least yearly for follow-up.  If you were to develop vaginal bleeding, pelvic pain, change to bowel or bladder function, or any other concerning symptoms, please reach out to my office.

## 2024-06-29 ENCOUNTER — Ambulatory Visit: Payer: Self-pay | Admitting: Radiation Oncology
